# Patient Record
Sex: Male | Born: 1960
Health system: Southern US, Community
[De-identification: ages and names within clinical notes are randomized; demographics above are authoritative.]

## PROBLEM LIST (undated history)

## (undated) DIAGNOSIS — F410 Panic disorder [episodic paroxysmal anxiety] without agoraphobia: Secondary | ICD-10-CM

## (undated) DIAGNOSIS — E785 Hyperlipidemia, unspecified: Secondary | ICD-10-CM

## (undated) DIAGNOSIS — K219 Gastro-esophageal reflux disease without esophagitis: Secondary | ICD-10-CM

## (undated) DIAGNOSIS — G709 Myoneural disorder, unspecified: Secondary | ICD-10-CM

## (undated) DIAGNOSIS — I10 Essential (primary) hypertension: Secondary | ICD-10-CM

## (undated) DIAGNOSIS — E119 Type 2 diabetes mellitus without complications: Secondary | ICD-10-CM

## (undated) DIAGNOSIS — IMO0001 Reserved for inherently not codable concepts without codable children: Secondary | ICD-10-CM

## (undated) DIAGNOSIS — E7800 Pure hypercholesterolemia, unspecified: Secondary | ICD-10-CM

## (undated) DIAGNOSIS — Z794 Long term (current) use of insulin: Secondary | ICD-10-CM

## (undated) DIAGNOSIS — I2510 Atherosclerotic heart disease of native coronary artery without angina pectoris: Secondary | ICD-10-CM

## (undated) DIAGNOSIS — J449 Chronic obstructive pulmonary disease, unspecified: Secondary | ICD-10-CM

## (undated) HISTORY — DX: Chronic obstructive pulmonary disease, unspecified: J44.9

## (undated) HISTORY — DX: Panic disorder (episodic paroxysmal anxiety): F41.0

## (undated) HISTORY — PX: UPPER GASTROINTESTINAL ENDOSCOPY: SHX188

## (undated) HISTORY — DX: Essential (primary) hypertension: I10

## (undated) HISTORY — DX: Atherosclerotic heart disease of native coronary artery without angina pectoris: I25.10

## (undated) HISTORY — DX: Pure hypercholesterolemia, unspecified: E78.00

## (undated) HISTORY — DX: Myoneural disorder, unspecified: G70.9

## (undated) HISTORY — PX: COLONOSCOPY: SHX174

## (undated) HISTORY — DX: Long term (current) use of insulin: Z79.4

## (undated) HISTORY — DX: Gastro-esophageal reflux disease without esophagitis: K21.9

## (undated) HISTORY — DX: Hyperlipidemia, unspecified: E78.5

## (undated) HISTORY — DX: Type 2 diabetes mellitus without complications: E11.9

## (undated) HISTORY — DX: Reserved for inherently not codable concepts without codable children: IMO0001

---
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
## 1994-11-23 HISTORY — PX: OTHER SURGICAL HISTORY: SHX169

## 1998-11-23 HISTORY — PX: TONSILLECTOMY: SUR1361

## 1999-02-04 ENCOUNTER — Emergency Department (HOSPITAL_COMMUNITY): Admission: EM | Admit: 1999-02-04 | Discharge: 1999-02-04 | Payer: Self-pay | Admitting: Emergency Medicine

## 2001-11-22 ENCOUNTER — Emergency Department (HOSPITAL_COMMUNITY): Admission: EM | Admit: 2001-11-22 | Discharge: 2001-11-22 | Payer: Self-pay | Admitting: *Deleted

## 2001-11-22 ENCOUNTER — Encounter: Payer: Self-pay | Admitting: *Deleted

## 2004-05-16 ENCOUNTER — Ambulatory Visit (HOSPITAL_COMMUNITY): Admission: RE | Admit: 2004-05-16 | Discharge: 2004-05-16 | Payer: Self-pay | Admitting: Family Medicine

## 2005-08-21 ENCOUNTER — Encounter: Payer: Self-pay | Admitting: Cardiovascular Disease

## 2006-11-23 HISTORY — PX: CAROTID STENT: SHX1301

## 2006-12-24 ENCOUNTER — Inpatient Hospital Stay (HOSPITAL_BASED_OUTPATIENT_CLINIC_OR_DEPARTMENT_OTHER): Admission: RE | Admit: 2006-12-24 | Discharge: 2006-12-24 | Payer: Self-pay | Admitting: Cardiovascular Disease

## 2006-12-28 ENCOUNTER — Ambulatory Visit (HOSPITAL_COMMUNITY): Admission: RE | Admit: 2006-12-28 | Discharge: 2006-12-29 | Payer: Self-pay | Admitting: Cardiovascular Disease

## 2008-10-26 ENCOUNTER — Ambulatory Visit (HOSPITAL_COMMUNITY): Admission: RE | Admit: 2008-10-26 | Discharge: 2008-10-26 | Payer: Self-pay | Admitting: Cardiovascular Disease

## 2009-11-23 HISTORY — PX: OTHER SURGICAL HISTORY: SHX169

## 2010-04-07 ENCOUNTER — Inpatient Hospital Stay (HOSPITAL_BASED_OUTPATIENT_CLINIC_OR_DEPARTMENT_OTHER): Admission: RE | Admit: 2010-04-07 | Discharge: 2010-04-07 | Payer: Self-pay | Admitting: Cardiovascular Disease

## 2010-12-23 ENCOUNTER — Ambulatory Visit
Admission: RE | Admit: 2010-12-23 | Discharge: 2010-12-23 | Payer: Self-pay | Source: Home / Self Care | Attending: Orthopedic Surgery | Admitting: Orthopedic Surgery

## 2010-12-23 ENCOUNTER — Encounter: Payer: Self-pay | Admitting: Orthopedic Surgery

## 2010-12-23 DIAGNOSIS — E119 Type 2 diabetes mellitus without complications: Secondary | ICD-10-CM | POA: Insufficient documentation

## 2010-12-23 DIAGNOSIS — M7710 Lateral epicondylitis, unspecified elbow: Secondary | ICD-10-CM | POA: Insufficient documentation

## 2010-12-23 DIAGNOSIS — I10 Essential (primary) hypertension: Secondary | ICD-10-CM | POA: Insufficient documentation

## 2010-12-23 DIAGNOSIS — Z8679 Personal history of other diseases of the circulatory system: Secondary | ICD-10-CM | POA: Insufficient documentation

## 2010-12-31 NOTE — Assessment & Plan Note (Signed)
Summary: left elbow pain needs xr/bcbs/bsf   Vital Signs:  Patient profile:   50 year old male Height:      69 inches Weight:      210 pounds BMI:     31.12 Temp:     98 degrees F  Visit Type:  new patient  CC:  left elbow pain.  History of Present Illness: I saw Craig Hopkins in the office today for an initial visit.  He is a 50 years old man with the complaint of:  left elbow pain  Xrays today.  Medications: Lipitor, Plavix, Lotrel, Aspirin, Hydrochlorithiazide, Insulin  LEFT elbow pain x6 months without any history of injury. The patient does note that he does some lifting at his job and he plays golf. No trauma recently. He does have sharp dull pain, rated 7/10, which seems to be intermittent.  He was referred to [place] by [name].      Preventive Screening-Counseling & Management  Alcohol-Tobacco     Alcohol drinks/day: 2     Smoking Status: current     Packs/Day: 0.25  Caffeine-Diet-Exercise     Caffeine use/day: yes     Does Patient Exercise: yes  Allergies (verified): No Known Drug Allergies  Past History:  Past Medical History: Diabetes High blood pressure Heart disease  Past Surgical History: Tonsillectomy 2000 Left wrist 1976 Heart Catherization 2011 Stent 2008 Throat polyp removed 1996  Family History: FH of Cancer:  Family History Coronary Heart Disease male < 43  Social History: Patient is married.    [name]  Caffeine use/day:  yes Alcohol drinks/day:  2 Ethnicity:  White Does Patient Exercise:  yes Smoking Status:  current Packs/Day:  0.25  Review of Systems Musculoskeletal:  Complains of joint pain, stiffness, and muscle pain; denies swelling, instability, redness, and heat. Psychiatric:  Complains of anxiety; denies nervousness, depression, and hallucinations. Hemoatologic:  Complains of brusing; denies easy bleeding.  The review of systems is negative for Constitutional, Cardiovascular, Respiratory,  Gastrointestinal, Genitourinary, Neurologic, Endocrine, Skin, HEENT, and Immunology.  Physical Exam  Skin:  intact without lesions or rashes Psych:  alert and cooperative; normal mood and affect; normal attention span and concentration   Shoulder/Elbow Exam  General:    Well-developed, well-nourished, normal body habitus; no deformities, normal grooming.    Skin:    Intact, no scars, lesions, rashes, cafe au lait spots or bruising.    Inspection:    Inspection is normal.    Palpation:    lateral epicondyle on the bone   Vascular:    Radial, ulnar, brachial, and axillary pulses 2+ and symmetric; capillary refill less than 2 seconds; no evidence of ischemia, clubbing, or cyanosis.    Sensory:    Gross sensation intact in the upper extremities.    Motor:    Normal strength in the upper extremities.    Elbow Exam:    Right:    Inspection/Palpation:  FROM with painful extension and pain elicited with wrist extension vs resisitance    Impression & Recommendations:  Problem # 1:  LATERAL EPICONDYLITIS (ICD-726.32) Assessment New  Separate and Identifiable X-Ray report      2 views left elbow   Normal joint and bobe   IMPR normal xrays elbow   Orders: New Patient Level III (30865) Elbow x-ray, 2 views (78469) Injection, Tendon / Ligament (62952) Depo- Medrol 40mg  (J1030)  Patient Instructions: 1)  ASPERCREME three times a day 2)  You have received an injection of cortisone  today. You may experience increased pain at the injection site. Apply ice pack to the area for 20 minutes every 2 hours and take 2 xtra strength tylenol every 8 hours. This increased pain will usually resolve in 24 hours. The injection will take effect in 3-10 days.  3)  Re check in 6 weeks    Orders Added: 1)  New Patient Level III [34742] 2)  Elbow x-ray, 2 views [73070] 3)  Injection, Tendon / Ligament [20550] 4)  Depo- Medrol 40mg  [J1030]

## 2011-01-14 NOTE — Letter (Signed)
Summary: History form  History form   Imported By: Jacklynn Ganong 01/08/2011 16:55:17  _____________________________________________________________________  External Attachment:    Type:   Image     Comment:   External Document

## 2011-01-21 ENCOUNTER — Encounter: Payer: Self-pay | Admitting: Orthopedic Surgery

## 2011-02-04 ENCOUNTER — Ambulatory Visit: Payer: Self-pay | Admitting: Orthopedic Surgery

## 2011-04-07 NOTE — Cardiovascular Report (Signed)
NAMEQUINLIN, CONANT               ACCOUNT NO.:  [id number]   MEDICAL RECORD NO.:  [id number]          PATIENT TYPE:  OIB   LOCATION:  2899                         FACILITY:  [facility]   PHYSICIAN:  Vesta Mixer, M.D. DATE OF BIRTH:  July 17, 1961   DATE OF PROCEDURE:  DATE OF DISCHARGE:  10/26/2008                            CARDIAC CATHETERIZATION   Craig Hopkins is a 50 year old gentleman with a history of coronary  artery disease.  He has a history of diabetes mellitus.  He recently was  seen in the office and was having some episodes of angina-like chest  pain.  The chest pains were somewhat unusual, but they were very similar  to his previous episodes of angina years ago, prior to his stent.  He is  scheduled for heart catheterization.   The procedure was left heart catheterization with coronary angiography.   The right femoral artery was easily cannulated using the modified  Seldinger technique.   HEMODYNAMICS:  LV pressure is 140/7 with an aortic pressure of 140/84.   ANGIOGRAPHY:  Left main:  The left main has minor luminal  irregularities.  There is perhaps a 10% stenosis at the ostium.   The left anterior descending artery has a long 20-30% stenosis just  prior to the stent.  This does not appear to obstruct flow in any way.  The mid-LAD stent is widely patent.  There is a normal step up and step  down associated with the stent.  The distal LAD is widely patent.  There  are a few minor luminal irregularities.  The diagonal arteries are  unremarkable.  There are several small diagonal vessels that have some  stenosis at the origin, but they really do not supply a large amount of  myocardium.  The distal diagonal vessels appear to be bigger than the  more proximal diagonal vessels.   The left circumflex artery is a large vessel.  It gives off a large  first obtuse marginal vessel.  There are minor luminal irregularities in  these vessels.  No critical stenosis.   The  right coronary artery is small to moderate in size but is dominant.  There are minor luminal irregularities.  The posterior descending artery  is unremarkable.  The posterolateral branch is quite small.   The left ventriculogram was performed in the 30 RAO position.  This  reveals normal left ventricular systolic function.  His ejection  fraction is 65%.  There is no mitral regurgitation.   COMPLICATIONS:  None.   CONCLUSION:  History of coronary artery disease but with a widely patent  left anterior descending stent.  He has minor luminal irregularities.  We will discharge him from the hospital.      Vesta Mixer, M.D.  Electronically Signed     PJN/MEDQ  D:  10/26/2008  T:  10/26/2008  Job:  [id number]

## 2011-04-07 NOTE — H&P (Signed)
NAMEISIDORO, Craig Hopkins               ACCOUNT NO.:  [id number]   MEDICAL RECORD NO.:  [id number]            PATIENT TYPE:   LOCATION:                                 FACILITY:   PHYSICIAN:  Vesta Mixer, M.D.      DATE OF BIRTH:   DATE OF ADMISSION:  DATE OF DISCHARGE:                              HISTORY & PHYSICAL   HISTORY:  Craig Hopkins is a middle-aged gentleman with a history of  diabetes mellitus, hyperlipidemia, hypertension, and coronary artery  disease.  He is admitted for heart catheterization after having  recurrent episodes of chest pain.   Craig Hopkins is a young gentleman with a long history of diabetes mellitus.  He  presented to me a year or so ago with some unusual episodes of chest  discomfort.  He had a stress Cardiolite study which was basically  unremarkable.  He has good exercise capacity and had only minimal  abnormality on his Cardiolite study.  Nevertheless, he continued to have  symptoms and we ultimately did a heart catheterization which revealed a  tight stenosis in his mid LAD.  He subsequently went on to have a PTCA  and stenting of the LAD using a 3.5 x 20 mm Taxus stent.  Post stent  dilatation was achieved using a 3.75-mm Quantum Monorail inflated up to  14 atmospheres.  He tolerated the procedure quite well and did not have  any other problems.  He continues to run and has been exercising fair  little.  He recently noticed some fullness or an unusual feeling up in  his chest.  He does not have any pain, but he typically has not had pain  when he had angina equivalent symptoms.  He has occasional episodes of  chest discomfort with fullness which radiates down into his left arm and  caused some left arm tingling.  He denies any syncope or presyncope.  He  denies any PND, orthopnea.  He has been trying to eat bit better.  He is  pretty much what he wants still on the weekends, but is trying to eat a  low salt, low-fat diet during the week.  He has also been  exercising on  a fairly regular basis.   CURRENT MEDICATIONS:  1. Insulin pump each day as directed by Dr. Evlyn Kanner.  2. Hydrochlorothiazide 25 mg a day.  3. Prilosec every other day.  4. Plavix 75 mg a day.  5. Lipitor 40 mg a day.  6. Aspirin 81 mg a day.  7. Lotrel once a day.   ALLERGIES:  None.   PAST MEDICAL HISTORY:  1. History of coronary artery disease - status post PTCA and stenting      of his mid LAD.  2. Diabetes mellitus.  3. Hyperlipidemia.  4. Hypertension.   SOCIAL HISTORY:  The patient used to smoke, but quit 2 years ago.  He  drinks alcohol weekly.   FAMILY HISTORY:  His father died at age 36 due to myocardial infarction.  His father had two myocardial infarctions.  His mother has a history of  breast cancer.   REVIEW OF SYSTEMS:  He denies any problems with heat or cold  intolerance, weight gain, or weight loss.  In fact, he has lost little  bit of weight over the past couple months because of improved diet and  exercise program.  He denies any headache or visual changes.  He denies  any vertigo or dizziness.  He denies any sore throat, cough, or sputum  production.  He denies any palpitations.  He denies any wheezing, cough,  shortness of breath, or hemoptysis.  He denies any nausea, vomiting, or  constipation, or change in his bowel habits.  He denies any problems  with his gait and in fact he runs fairly regular.  His review of systems  is reviewed and is otherwise negative.   On exam, he is a middle-aged gentleman in no acute distress.  He is  alert and oriented x3.  His mood and affect are normal.  His weight is  207, blood pressure is 144/90.  His heart rate 56.  His HEENT exam  reveals 2+ carotids.  He has no bruits, no JVD, no thyromegaly.  His  neck is supple.  Sclerae are nonicteric.  His mucous membranes are  moist.  His lungs are clear.  Chest expansion is normal.  Heart, regular  rate, S1 and S2.  His PMI is nondisplaced.  Abdominal exam  reveals good  bowel sounds and is nontender.  Extremities has no clubbing, cyanosis,  or edema.  Neurologic exam is nonfocal.   Craig Hopkins presents with some episodes of chest discomfort as he would put it  an unusual feeling in his chest.  There are some radiation down his left  arm.  Because he had significant coronary artery disease last year and  at the same time, had a relatively unremarkable Cardiolite study.  I am  not sure that we can use Cardiolite scan to completely rule out coronary  artery disease.  We discussed the risks, benefits, and options of heart  catheterization.  He understands and agrees to have heart  catheterization for further evaluation of this issue.  I think it is  certainly reasonable to proceed with heart catheterization since the  previous stress test was unreliable.  We will schedule him for a heart  catheterization on October 26, 2008.      Vesta Mixer, M.D.  Electronically Signed     PJN/MEDQ  D:  10/23/2008  T:  10/24/2008  Job:  [id number]   cc:   Jeannett Senior A. Evlyn Kanner, M.D.

## 2011-04-10 NOTE — Cardiovascular Report (Signed)
Craig Hopkins, Craig Hopkins               ACCOUNT NO.:  [id number]   MEDICAL RECORD NO.:  [id number]          PATIENT TYPE:  OIB   LOCATION:  1965                         FACILITY:  [facility]   PHYSICIAN:  Vesta Mixer, M.D. DATE OF BIRTH:  [birth year]   DATE OF PROCEDURE:  12/24/2006  DATE OF DISCHARGE:                            CARDIAC CATHETERIZATION   Mr. Brosseau is a 50 year old gentleman with a history of hypertension,  insulin-dependent diabetes mellitus, hypercholesterolemia, and cigarette  smoking.  He was referred for a stress Cardiolite study after having  pressure-like chest pain.  The pains will occasionally radiate down his  left arm.  They are not necessarily associated with a specific activity.   He had a stress Cardiolite study which revealed reversible ischemia of  the apex.  He was scheduled for heart catheterization for further  evaluation.   PROCEDURE:  Left heart catheterization with coronary angiography.   HEMODYNAMIC RESULTS:  LV pressure is 114/6, with an aortic pressure of  114/92.   ANGIOGRAPHY:  The left main is fairly smooth and normal.   The left anterior descending artery is a moderate to large vessel.  There is a long stenosis in the proximal LAD of about 30-35%.  Following  this, there is a very short normal segment, and then in the mid-LAD  there is a hazy 80-90% stenosis.  This stenosis is approximately 15-20  mm in length.  The lesion appears to be hazy in some views.  The distal  LAD reaches around the apex and supplies the inferoapical wall.  There  are no significant irregularities in the distal LAD.   The left circumflex artery is normal.  There is a large first obtuse  marginal artery which is normal.  The posterior descending artery is  normal.   The right coronary artery is a moderate-sized vessel.  There are  proximal irregularities between 10 and 20%.  The mid-RCA has a 20 to 25%  stenosis.  The posterior descending artery and the  posterolateral  segment artery have minor luminal irregularities.   Left ventriculogram was performed in a 30 RAO position.  It reveals a  mildly dilated left ventricle.  The left ventricular systolic function  is well-preserved.  Ejection fraction is 55-60%.  There is normal  contractility of the anterior wall and apex.   COMPLICATIONS:  None.   CONCLUSION:  1. Single-vessel coronary artery disease involving mid-left anterior      descending.  He also has some mild disease      in the mid-right.  We will schedule him for percutaneous coronary      intervention of his mid-left anterior descending next week.  We      will need to have him stop smoking.  He will need tighter control      on his cholesterol medicines.  He will also need tighter glucose      control.  We will start him on Plavix today.           ______________________________  Vesta Mixer, M.D.     PJN/MEDQ  D:  12/24/2006  T:  12/24/2006  Job:  [id number]   cc:   Tera Mater. Evlyn Kanner, M.D.

## 2011-04-10 NOTE — Discharge Summary (Signed)
NAMEPAULINO, Craig Hopkins               ACCOUNT NO.:  [id number]   MEDICAL RECORD NO.:  [id number]          PATIENT TYPE:  OIB   LOCATION:  6529                         FACILITY:  [facility]   PHYSICIAN:  Vesta Mixer, M.D. DATE OF BIRTH:  [birth year]   DATE OF ADMISSION:  12/28/2006  DATE OF DISCHARGE:  12/29/2006                               DISCHARGE SUMMARY   DISCHARGE DIAGNOSES:  1. Coronary artery disease, status post percutaneous transluminal      coronary angioplasty and stenting of his left anterior descending      artery.  2. Hypertension.  3. Diabetes mellitus.  4. Hyperlipidemia.   DISCHARGE MEDICATIONS:  1. Plavix 75 mg a day.  2. Z-Pak as directed.  3. Aspirin 325 mg a day.  4. Altace 10 mg p.o. b.i.d.  5. Prilosec over-the-counter once a day.  6. Hydrochlorothiazide 25 mg once a day.  7. Chantix 1 mg twice a day.  8. Lipitor 40 mg a day.  9. Insulin pump as directed by Dr. Evlyn Kanner.  10.Nitroglycerin 0.4 mg as needed.   DISPOSITION:  The patient will see Dr. Elease Hashimoto in 1-2 weeks for followup  visit.  He is to see Dr. Evlyn Kanner as needed.   HISTORY:  Craig Hopkins is a 50 year old gentleman who was recently  found to have a tight LAD stenosis.  He was admitted for PCI of his LAD.   The patient was found by diagnostic heart catheterization to have a  tight 80-90% mid LAD stenosis.  He underwent successful PTCA and  stenting.  We used a 3.5-mm x 20-mm Taxus stent was deployed at 12  atmospheres.  Post stent diltation was achieved using a 3.75-mm Quantum  balloon.  It was inflated up to 18 atmospheres in the middle of the  vessel and then 12 and 14 on each of the ends.  This resulted in a very  nice angiographic result with an zero percent residual.  He now is  discharged in satisfactory condition.  He will continue on the same  medications.  All of his other medical problems were stable.           ______________________________  Vesta Mixer, M.D.    PJN/MEDQ  D:   12/29/2006  T:  12/29/2006  Job:  [id number]   cc:   Jeannett Senior A. Evlyn Kanner, M.D.

## 2011-04-10 NOTE — Cardiovascular Report (Signed)
Craig Hopkins, Craig Hopkins               ACCOUNT NO.:  [id number]   MEDICAL RECORD NO.:  [id number]          PATIENT TYPE:  OIB   LOCATION:  2807                         FACILITY:  [facility]   PHYSICIAN:  Vesta Mixer, M.D. DATE OF BIRTH:  01-23-1961   DATE OF PROCEDURE:  12/28/2006  DATE OF DISCHARGE:                            CARDIAC CATHETERIZATION   Mathews is a 50 year old gentleman with a history of hyperlipidemia,  cigarette smoking and diabetes mellitus.  He recently had a diagnostic  heart catheterization that revealed a tight mid-LAD stenosis.  He is now  referred for PCI of his mid LAD.   The patient was preloaded with Plavix 4 days prior to the procedure.   PROCEDURE:  1. Left heart catheterization.  2. Percutaneous transluminal cardiac angioplasty and stenting of the      mid left anterior descending.   DESCRIPTION OF PROCEDURE:  The right femoral artery was easily  cannulated using a modified Seldinger technique.  We placed a 7-French  catheter.  The patient received a total of 6500 units of heparin.  He  received a double bolus Integrilin drip.  ACT was 270.   The left main was engaged using a Judkins left 4 guide.  The initial  angiography revealed a 30-35% stenosis in the proximal LAD with a tight  80-90% stenosis in the mid LAD.   A short BMW wire was placed down into the distal left anterior  descending artery.  A 3.0 x 15 mm Quantum Maverick was placed across the  stenosis and was inflated to 10 atmospheres for 35 seconds followed by  14 atmospheres for 25 seconds.  This resulted in some improvement of the  vessel lumen, but clearly the 3.0 balloon was undersized.   This point, a 3.5 x 20 mm TAXUS was positioned across the stenosis.  It  was deployed at 12 atmospheres for 40 seconds.   Post stent dilatation was achieved using a 3.75 x 15 mm Quantum  Maverick.  Was inflated in the center of the stent up to 14 atmospheres  for 30 seconds.  It was then placed in  the distal aspect of the stent  and was inflated up to 12 atmospheres for 15 seconds and then pulled  back to the proximal edge to 12 atmospheres for 15 seconds.  There was  still a slight waist in the middle of the stent, so once again the  balloon was positioned in the middle of stent and was inflated up to 18  atmospheres for 25 seconds.  This resulted in the very nice lumen with a  0% residual stenosis.  The patient tolerated the procedure quite well.   COMPLICATIONS:  None.   CONCLUSION:  Primarily single-vessel coronary artery disease.  Status  post successful percutaneous transluminal cardiac angioplasty and  stenting of the mid left anterior descending. He does have moderate  disease in the proximal left anterior descending and has moderate  disease in the right.  We will treat these areas medically.  We will  keep him overnight anticipate discharge tomorrow.  ______________________________  Vesta Mixer, M.D.     PJN/MEDQ  D:  12/28/2006  T:  12/28/2006  Job:  [id number]   cc:   Jeannett Senior A. Evlyn Kanner, M.D.

## 2011-05-13 ENCOUNTER — Other Ambulatory Visit: Payer: Self-pay | Admitting: Cardiovascular Disease

## 2011-05-13 DIAGNOSIS — E785 Hyperlipidemia, unspecified: Secondary | ICD-10-CM

## 2011-05-13 NOTE — Telephone Encounter (Signed)
Pt called/msg left for need of yearly ov with labs, 90 days given and lab orders placed.

## 2011-08-28 LAB — GLUCOSE, CAPILLARY
Glucose-Capillary: 234 mg/dL — ABNORMAL HIGH (ref 70–99)
Glucose-Capillary: 257 mg/dL — ABNORMAL HIGH (ref 70–99)

## 2011-11-12 ENCOUNTER — Other Ambulatory Visit: Payer: Self-pay | Admitting: Cardiovascular Disease

## 2011-11-12 NOTE — Telephone Encounter (Signed)
Dosage was 80 mg -generic for lipitor he breaks in half

## 2011-11-12 NOTE — Telephone Encounter (Signed)
Left a message on pt cell phone wanting him to call office to verify his dosage for his Lipitor.

## 2011-11-13 NOTE — Telephone Encounter (Signed)
Pt needs appointment then refill can be made Fax Received. Refill Completed. Aaryanna Hyden Chowoe (R.M.A)   

## 2012-01-12 ENCOUNTER — Other Ambulatory Visit: Payer: Self-pay | Admitting: Cardiovascular Disease

## 2012-01-12 MED ORDER — ATORVASTATIN CALCIUM 80 MG PO TABS: 40.0000 mg | ORAL_TABLET | Freq: Every day | ORAL | Status: DC

## 2012-01-12 NOTE — Telephone Encounter (Signed)
Pt needs a refill and he needs 90 day supply with 3 refills and he needs double the dose and he will split the pills

## 2012-01-12 NOTE — Telephone Encounter (Signed)
Spoke to pt and told him that he has to keep his Appt with Dr. Elease Hashimoto for him to get the 90 days supply with refill. Pt then requested 45 days supply of his Lipitor to last him til his appt. Fax Received. Refill Completed. Gunhild Bautch Chowoe (R.M.A)

## 2012-02-23 ENCOUNTER — Ambulatory Visit (INDEPENDENT_AMBULATORY_CARE_PROVIDER_SITE_OTHER): Payer: BC Managed Care – PPO | Admitting: Cardiovascular Disease

## 2012-02-23 ENCOUNTER — Ambulatory Visit: Payer: BC Managed Care – PPO | Admitting: Cardiovascular Disease

## 2012-02-23 ENCOUNTER — Encounter: Payer: Self-pay | Admitting: Cardiovascular Disease

## 2012-02-23 VITALS — BP 128/82 | HR 100 | Resp 18 | Ht 68.0 in | Wt 213.4 lb

## 2012-02-23 DIAGNOSIS — I2510 Atherosclerotic heart disease of native coronary artery without angina pectoris: Secondary | ICD-10-CM | POA: Insufficient documentation

## 2012-02-23 DIAGNOSIS — I10 Essential (primary) hypertension: Secondary | ICD-10-CM

## 2012-02-23 DIAGNOSIS — E785 Hyperlipidemia, unspecified: Secondary | ICD-10-CM | POA: Insufficient documentation

## 2012-02-23 NOTE — Assessment & Plan Note (Addendum)
Ashely seems to be doing fairly well. He does have some dyspnea on exertion but I suspect that this is due to his overall deconditioning. I don't think he is having episodes of silent ischemia.  We will have him continue with his current dose of atorvastatin and Plavix. Check a lipid level this week and again in 6 months when I see him for an office visit.  I've asked him to go and completely stop smoking. I've recommended that he use some of the Nicorette products such as the inhaler or patch. I've asked him to call me if he has any other problems.

## 2012-02-23 NOTE — Patient Instructions (Signed)
Your physician wants you to follow-up in: 6 months You will receive a reminder letter in the mail two months in advance. If you don't receive a letter, please call our office to schedule the follow-up appointment.  Your physician recommends that you return for a FASTING lipid profile: 6 months and this friday

## 2012-02-23 NOTE — Progress Notes (Signed)
Craig Hopkins Date of Birth  [birth year] [facility]     [place] Office  [address]    [address]   [address] [place], [place]  [address]    [place], [place]  [address] [phone]  Fax  [phone]  [phone]  Fax [phone]  Problem List: 1. CAD - s/p stenting of his LAD 2. Diabetes Mellitus 3. Hypertension  History of Present Illness:  Craig Hopkins is a 51 y.o. gentleman with a hx as noted above.  He has continued to have intermittant cp.  He has some dyspnea with exertion. His last cath was 2011 which revealed a patent LAD stent.  He's had some dyspnea on exertion but he admits that he has not been exercising at all. He still smokes a few cigarettes a week.  Current Outpatient Prescriptions on File Prior to Visit  Medication Sig Dispense Refill  . Amlodipine Besy-Benazepril HCl (LOTREL PO) Take by mouth daily.        Marland Kitchen aspirin 325 MG tablet Take 325 mg by mouth daily.        Marland Kitchen atorvastatin (LIPITOR) 80 MG tablet Take 0.5 tablets (40 mg total) by mouth daily.  45 tablet  0  . BuPROPion HCl (WELLBUTRIN PO) Take by mouth daily.      . clopidogrel (PLAVIX) 75 MG tablet Take 75 mg by mouth daily.        . hydrochlorothiazide (HYDRODIURIL) 25 MG tablet Take 25 mg by mouth daily.        Marland Kitchen NOVOLOG 100 UNIT/ML injection       . OMEPRAZOLE PO Take by mouth daily.        No Known Allergies  Past Medical History  Diagnosis Date  . HTN (hypertension)   . Panic attack   . GERD (gastroesophageal reflux disease)   . IDDM (insulin dependent diabetes mellitus)     Past Surgical History  Procedure Date  . Tonsillectomy 2000  . Left wrist 1976  . Heart catherization 2011  . Carotid stent 2008  . Throat polyp removed 1996    History  Smoking status  . Smoker, Current Status Unknown  Smokeless tobacco  . Not on file    History  Alcohol Use  . 0.5 oz/week  . 1 drink(s) per week    Family History  Problem Relation Age of Onset  . Cancer      family  history   . Coronary artery disease      family history   . Coronary artery disease Father   . Stroke Father   . Breast cancer Mother 21    Reviw of Systems:  Reviewed in the HPI.  All other systems are negative.  Physical Exam: Blood pressure 128/82, pulse 100, resp. rate 18, height 5\' 8"  (1.727 m), weight 213 lb 6.4 oz (96.798 kg). General: Well developed, well nourished, in no acute distress.  Head: Normocephalic, atraumatic, sclera non-icteric, mucus membranes are moist,   Neck: Supple. Carotids are 2 + without bruits. No JVD  Lungs: Clear bilaterally to auscultation.  Heart: regular rate.  normal  S1 S2. No murmurs, gallops or rubs.  Abdomen: Soft, non-tender, non-distended with normal bowel sounds. No hepatomegaly. No rebound/guarding. No masses.  Msk:  Strength and tone are normal  Extremities: No clubbing or cyanosis. No edema.  Distal pedal pulses are 2+ and equal bilaterally.  Neuro: Alert and oriented X 3. Moves all extremities spontaneously.  Psych:  Responds to questions appropriately with  a normal affect.  ECG: 02/23/2012. Normal sinus rhythm at 81 beats a minute. Normal EKG.  Assessment / Plan:

## 2012-02-26 ENCOUNTER — Other Ambulatory Visit (INDEPENDENT_AMBULATORY_CARE_PROVIDER_SITE_OTHER): Payer: BC Managed Care – PPO

## 2012-02-26 DIAGNOSIS — E785 Hyperlipidemia, unspecified: Secondary | ICD-10-CM

## 2012-02-26 LAB — HEPATIC FUNCTION PANEL
ALT: 29 U/L (ref 0–53)
Albumin: 3.6 g/dL (ref 3.5–5.2)
Bilirubin, Direct: 0.2 mg/dL (ref 0.0–0.3)
Total Protein: 6.7 g/dL (ref 6.0–8.3)

## 2012-02-26 LAB — BASIC METABOLIC PANEL
CO2: 31 mEq/L (ref 19–32)
Chloride: 102 mEq/L (ref 96–112)
Creatinine, Ser: 1 mg/dL (ref 0.4–1.5)
Glucose, Bld: 111 mg/dL — ABNORMAL HIGH (ref 70–99)

## 2012-02-26 LAB — LIPID PANEL
Cholesterol: 119 mg/dL (ref 0–200)
Total CHOL/HDL Ratio: 2
Triglycerides: 45 mg/dL (ref 0.0–149.0)

## 2012-04-26 ENCOUNTER — Encounter: Payer: Self-pay | Admitting: Cardiovascular Disease

## 2012-04-27 ENCOUNTER — Encounter: Payer: Self-pay | Admitting: Cardiovascular Disease

## 2012-05-05 ENCOUNTER — Other Ambulatory Visit: Payer: Self-pay | Admitting: Cardiovascular Disease

## 2012-05-05 NOTE — Telephone Encounter (Signed)
Fax Received. Refill Completed. Kathleena Freeman Chowoe (R.M.A)   

## 2012-05-06 DIAGNOSIS — Z720 Tobacco use: Secondary | ICD-10-CM | POA: Insufficient documentation

## 2013-03-03 ENCOUNTER — Encounter: Payer: Self-pay | Admitting: Cardiovascular Disease

## 2013-05-29 ENCOUNTER — Other Ambulatory Visit: Payer: Self-pay | Admitting: *Deleted

## 2013-05-29 MED ORDER — ATORVASTATIN CALCIUM 80 MG PO TABS: 40.0000 mg | ORAL_TABLET | Freq: Every day | ORAL | Status: DC

## 2013-05-29 NOTE — Telephone Encounter (Signed)
NEED APPOINTMENT. left message for pt call office to make 1 yr OV. number provided. Fax Received. Refill Completed. Wynette Jersey Chowoe (R.M.A)

## 2014-03-07 ENCOUNTER — Other Ambulatory Visit: Payer: Self-pay | Admitting: Cardiovascular Disease

## 2014-03-08 ENCOUNTER — Telehealth: Payer: Self-pay

## 2014-03-08 NOTE — Telephone Encounter (Signed)
Needs lab work LFTls and lipid profile to fill lipitor

## 2014-03-09 NOTE — Telephone Encounter (Signed)
Called wanting to get refill on his Lipitor.  Advised that we needed recent labs.  He states Dr. Forde Dandy checked his cholesterol about 3 months ago.  He will call their office and see if they would refill.  Also explained to him that he had not been seen by Dr. Acie Fredrickson since 02/2012.  Also asked him to have Dr. Baldwin Crown office send Dr. Acie Fredrickson the lab results so we can have for our records.  He will call them to do refill and labs to be sent here. Also needs an appointment with Dr. Acie Fredrickson.

## 2014-03-09 NOTE — Telephone Encounter (Signed)
lmovm Debbie Zackrey Dyar RN  

## 2014-04-23 ENCOUNTER — Telehealth: Payer: Self-pay | Admitting: Orthopedic Surgery

## 2014-04-23 NOTE — Telephone Encounter (Signed)
Call received from patient, following having been seen at Urgent care in [facility] "not the [facility]" urgent care.  States had Xrays and was told has a humerus fracture.  Michela Pitcher was seen there by an orthopedic specialist.  Patient was seen last here by Dr Aline Brochure in 2012. Advised patient of records, films needed for review, as it would be a 2nd opinion, if it was an orthopedist who had initially treated.  He will request this information and was told to contact us immediately due to the nature of the problem needing appointment soon as possoble.  His ph# is [phone]

## 2014-04-25 NOTE — Telephone Encounter (Signed)
Per Dr Aline Brochure 04/24/14, following receipt of office notes from Raliegh Ip - appointment approved.  Called back to patient 04/25/14, scheduled appointment.

## 2014-04-30 ENCOUNTER — Ambulatory Visit (INDEPENDENT_AMBULATORY_CARE_PROVIDER_SITE_OTHER): Payer: BC Managed Care – PPO | Admitting: Orthopedic Surgery

## 2014-04-30 ENCOUNTER — Encounter: Payer: Self-pay | Admitting: Orthopedic Surgery

## 2014-04-30 VITALS — BP 123/90 | Ht 69.0 in | Wt 201.0 lb

## 2014-04-30 DIAGNOSIS — S42201A Unspecified fracture of upper end of right humerus, initial encounter for closed fracture: Secondary | ICD-10-CM

## 2014-04-30 DIAGNOSIS — S42209A Unspecified fracture of upper end of unspecified humerus, initial encounter for closed fracture: Secondary | ICD-10-CM

## 2014-04-30 MED ORDER — OXYCODONE-ACETAMINOPHEN 5-325 MG PO TABS: 1.0000 | ORAL_TABLET | ORAL | Status: DC | PRN

## 2014-04-30 MED ORDER — HYDROCODONE-ACETAMINOPHEN 5-325 MG PO TABS: 1.0000 | ORAL_TABLET | ORAL | Status: DC | PRN

## 2014-04-30 NOTE — Progress Notes (Signed)
Patient ID: Craig Hopkins, male   DOB: January 10, 1961, 53 y.o.   MRN: [id number]  Chief Complaint  Patient presents with  . Pain    Proximal right humerus fracture d/t injury 04/20/14    BP 123/90  Ht 5\' 9"  (1.753 m)  Wt 201 lb (91.173 kg)  BMI 29.67 kg/m2  This is a 53 year old male who was running in the house with his son jumped over the couch landed on a hardwood floor onto the right shoulder and sustained a proximal humerus fracture nondisplaced. Initial treatment at [facility] urgent care. Injury on May 29.  He complains of stiffness sharp dull throbbing pain over the right shoulder with some radiation into the upper arm. His pain is relieved by Percocet. His pain is exacerbated by movement. He is in a sling-and-swathe. He works from home.  His review of systems is negative except for his symptoms related to diabetes to musculoskeletal pain from this injury and the fact that he needs reading glasses  He has no medication allergies. He has a family history of diabetes and heart disease and heart are tension and cancer  His parents are deceased 1 died at 36 of cancer and his father died of a heart attack at 55  Past medical history of diabetes hypertension heart disease. A. status post left wrist fracture followup excision from the vocal cord tonsillectomy and a stent placed  Medications reviewed and recorded  Examination Vital signs: BP 123/90  Ht 5\' 9"  (1.753 m)  Wt 201 lb (91.173 kg)  BMI 29.67 kg/m2   General the patient is well-developed and well-nourished grooming and hygiene are normal Oriented x3 Mood and affect normal Ambulation normal  ISkin clean dry and intact  Cardiovascular exam is normal Sensory exam normal  Right Shoulder Exam   Tenderness  Right shoulder tenderness location: right proximal humerus   Range of Motion  Active Abduction: abnormal  Passive Abduction: abnormal  Extension: abnormal  Forward Flexion: abnormal  External Rotation:  abnormal  Internal Rotation 0 degrees: abnormal  Internal Rotation 90 degrees: abnormal   Muscle Strength  Right shoulder normal muscle strength: normal tone   Other  Erythema: present Scars: absent Sensation: normal Pulse: present   Left Shoulder Exam  Left shoulder exam is normal.     The x-rays were brought in on a disc AP lateral nondisplaced proximal humerus fracture at the surgical  I explained the treatment options and rationale  Recommend sling-and-swathe for an additional week an x-ray and then if x-rays show no displacement and we can entertain physical therapy

## 2014-04-30 NOTE — Patient Instructions (Signed)
Shoulder Fracture (Proximal Humerus or Glenoid) °A shoulder fracture is a broken upper arm bone or a broken socket bone. The humerus is the upper arm bone and the glenoid is the shoulder socket. Proximal means the humerus is broken near the shoulder. Most of the time the bones of a broken shoulder are in an acceptable position. Usually, the injury can be treated with a shoulder immobilizer or sling and swath bandage. These devices support the arm and prevent any shoulder movement. If the bones are not in a good position, then surgery is sometimes needed. Shoulder fractures usually initially cause swelling, pain, and discoloration around the upper arm. They heal in 8 to 12 weeks with proper treatment. °SYMPTOMS  °At the time of injury: °· Pain. °· Tenderness. °· Regular body contours are not normal. °Later symptoms may include: °· Swelling and bruising of the elbow and hand. °· Swelling and bruising of the arm or chest. °Other symptoms include: °· Pain when lifting or turning the arm. °· Paralysis below the fracture. °· Numbness or coldness below the fracture. °CAUSES  °· Indirect force from falling on an outstretched arm. °· A blow to the shoulder. °RISK INCREASES WITH: °· Not being in shape. °· Playing contact sports, such as football, soccer, hockey, or rugby. °· Sports where falling on an outstretched arm occurs, such as basketball, skateboarding, or volleyball. °· History of bone or joint disease. °· History of shoulder injury. °PREVENTION °· Warm up before activity. °· Stretch before activity. °· Stay in shape with your: °· Heart fitness. °· Flexibility. °· Shoulder Strength. °· Falling with the proper technique. °PROGNOSIS  °In adults, healing time is about 7 weeks. For children, healing time is about 5 weeks. Surgery may be needed. °RELATED COMPLICATIONS °· The bones do not heal together (nonunion). °· The bones do not align properly when they heal (malunion). °· Flecia Shutter-term problems with pain, stiffness,  swelling, or loss of motion. °· The injured arm heals shorter than the other. °· Nerves are injured in the arm. °· Arthritis in the shoulder. °· Normal bone growth is interrupted in children. °· Blood supply to the shoulder joint is diminished. °TREATMENT °If the bones are aligned, then initial treatment will be with ice and medicine to help with pain. The shoulder will be held in place with a sling (immobilization). The shoulder will be allowed to heal for up to 6 weeks. Injuries that may need surgery include: °· Severe fractures. °· Fractures that are not in appropriate alignment (displaced). °· Non-displaced fractures (not common). °Surgery helps the bones align correctly. The bones may be held in place with: °· Sutures. °· Wires. °· Rods. °· Plates. °· Screws. °· Pins. °If you have had surgery or not, you will likely be assisted by a physical therapist or athletic trainer to get the best results with your injured shoulder. This will likely include exercises to strengthen and stretch the injured and surrounding areas. °MEDICATION °· If pain medicine is needed, nonsteroidal anti-inflammatory medicines (such as aspirin or ibuprofen) or other minor pain relievers (such as acetaminophen) are often advised. °· Do not take pain medicine for 7 days before surgery. °· Stronger pain relievers may be prescribed. Use only as directed and take only as much as you need. °COLD THERAPY °Cold treatment (icing) relieves pain and reduces inflammation. Cold treatment should be applied for 10 to 15 minutes every 2 to 3 hours, and immediately after activity that aggravates your symptoms. Use ice packs or an ice massage. °SEEK IMMEDIATE   MEDICAL CARE IF: °· You have severe shoulder pain unrelieved by rest and taking pain medicine. °· You have pain, numbness, tingling, or weakness in the hand or wrist. °· You have shortness of breath, chest pain, severe weakness, or fainting. °· You have severe pain with motion of the fingers or  wrist. °· Blue, gray, or dark color appears in the fingernails on injured extremity. °Document Released: 11/09/2005 Document Revised: 02/01/2012 Document Reviewed: 02/21/2009 °Exit[employer]® Patient Information ©2014 [employer]. ° °

## 2014-05-07 ENCOUNTER — Ambulatory Visit (INDEPENDENT_AMBULATORY_CARE_PROVIDER_SITE_OTHER): Payer: BC Managed Care – PPO

## 2014-05-07 ENCOUNTER — Encounter: Payer: Self-pay | Admitting: Orthopedic Surgery

## 2014-05-07 ENCOUNTER — Ambulatory Visit (INDEPENDENT_AMBULATORY_CARE_PROVIDER_SITE_OTHER): Payer: Self-pay | Admitting: Orthopedic Surgery

## 2014-05-07 VITALS — BP 150/91 | Ht 69.0 in | Wt 201.0 lb

## 2014-05-07 DIAGNOSIS — S4290XA Fracture of unspecified shoulder girdle, part unspecified, initial encounter for closed fracture: Secondary | ICD-10-CM

## 2014-05-07 DIAGNOSIS — S42201A Unspecified fracture of upper end of right humerus, initial encounter for closed fracture: Secondary | ICD-10-CM

## 2014-05-07 DIAGNOSIS — S42209A Unspecified fracture of upper end of unspecified humerus, initial encounter for closed fracture: Secondary | ICD-10-CM

## 2014-05-07 MED ORDER — OXYCODONE-ACETAMINOPHEN 5-325 MG PO TABS: 1.0000 | ORAL_TABLET | ORAL | Status: DC | PRN

## 2014-05-07 NOTE — Patient Instructions (Signed)
Start therapy right shoulder

## 2014-05-07 NOTE — Progress Notes (Signed)
Patient ID: Craig Hopkins, male   DOB: November 02, 1961, 53 y.o.   MRN: [id number]  Chief Complaint  Patient presents with  . Follow-up    1 week recheck on right shoulder fracture with xray. DOI 04-20-14.    17 days post proximal humerus fracture right shoulder. Repeat x-rays today show fracture is in good position healing appropriately  He still having quite a bit of discomfort in his right shoulder. When we decreased his Percocet from 10-5 mg he now has take every 4 hours good relief. Has some proximal ecchymosis under the skin he has good flexion of his elbow good grip strength normal pulse normal sensation and mild palpable tenderness.  Recommend that he start physical therapy program with active assisted range of motion of the right shoulder  Recommend 3-4 week followup.

## 2014-05-10 ENCOUNTER — Ambulatory Visit (HOSPITAL_COMMUNITY)
Admission: RE | Admit: 2014-05-10 | Discharge: 2014-05-10 | Disposition: A | Discharge status: Home / Self Care | Payer: BC Managed Care – PPO | Source: Ambulatory Visit | Attending: Orthopedic Surgery | Admitting: Orthopedic Surgery

## 2014-05-10 DIAGNOSIS — E785 Hyperlipidemia, unspecified: Secondary | ICD-10-CM | POA: Insufficient documentation

## 2014-05-10 DIAGNOSIS — IMO0001 Reserved for inherently not codable concepts without codable children: Secondary | ICD-10-CM | POA: Insufficient documentation

## 2014-05-10 DIAGNOSIS — M6281 Muscle weakness (generalized): Secondary | ICD-10-CM | POA: Insufficient documentation

## 2014-05-10 DIAGNOSIS — M25611 Stiffness of right shoulder, not elsewhere classified: Secondary | ICD-10-CM | POA: Insufficient documentation

## 2014-05-10 DIAGNOSIS — M25519 Pain in unspecified shoulder: Secondary | ICD-10-CM | POA: Insufficient documentation

## 2014-05-10 DIAGNOSIS — M25619 Stiffness of unspecified shoulder, not elsewhere classified: Secondary | ICD-10-CM | POA: Insufficient documentation

## 2014-05-10 NOTE — Evaluation (Signed)
Occupational Therapy Evaluation  Patient Details  Name: Craig Hopkins MRN: [id number] Date of Birth: 04/05/1961  Today's Date: 05/10/2014 Time: 1521-1600 OT Time Calculation (min): 39 min OT eval 1521-1600 39'  Visit#: 1 of 16  Re-eval: 06/07/14  Assessment Diagnosis: Rt proximal humerus fx Next MD Visit: 06/11/14 - Dr Aline Brochure    Past Medical History:  Past Medical History  Diagnosis Date  . HTN (hypertension)   . Panic attack   . GERD (gastroesophageal reflux disease)   . IDDM (insulin dependent diabetes mellitus)    Past Surgical History:  Past Surgical History  Procedure Laterality Date  . Tonsillectomy  2000  . Left wrist  1976  . Heart catherization  2011  . Carotid stent  2008  . Throat polyp removed  1996    Subjective Symptoms/Limitations Symptoms: S: I ultimately would like to be able to do my own therapy at home and not have to come to therapy. Pertinent History: On Apr 20, 2014 patient was running though house chasing his teenage son when he jumped over a couch and fell on right arm sustaining a right proximal humerus fx. X-ray shows fracture is postioned correctly to heal. Patient is wearing sling out in the community only. Presently takes Percocet for pain. Dr. Aline Brochure has referred patient to occupational therapy for evaluation and treatment.  Limitations: Using right arm for daily tasks. writing, self-feeding, raising arm up overhead. Special Tests: FOTO score: 37/100 Patient Stated Goals: to be able to complete therapy at home.  Pain Assessment Currently in Pain?: Yes Pain Score: 2  Pain Location: Shoulder Pain Orientation: Right Pain Type: Acute pain Pain Relieving Factors: Percocet  Precautions/Restrictions  Precautions Precautions: None Restrictions Weight Bearing Restrictions: No  Balance Screening Balance Screen Has the patient fallen in the past 6 months: Yes How many times?: 1 Has the patient had a decrease in activity level because  of a fear of falling? : No Is the patient reluctant to leave their home because of a fear of falling? : No  Prior Functioning  Home Living Family/patient expects to be discharged to:: Private residence Living Arrangements: Spouse/significant other;Children Available Help at Discharge: Family Type of Home: House Prior Function Level of Independence: Independent with basic ADLs;Independent with gait  Able to Take Stairs?: Yes Driving: Yes Vocation: Full time employment Vocation Requirements: works in [employer] Leisure: Hobbies-yes (Comment) Comments: sports and likes to be active  Assessment ADL/Vision/Perception ADL ADL Comments: Difficulty reaching up overhead, writing, self-feeding, dressing and bathing. Dominant Hand: Right Vision - History Baseline Vision: No visual deficits  Cognition/Observation Cognition Overall Cognitive Status: Within Functional Limits for tasks assessed Arousal/Alertness: Awake/alert Orientation Level: Oriented X4  Additional Assessments RUE Assessment RUE Assessment:  (assessed supine. IR/ER adducted) RUE AROM (degrees) Right Shoulder Flexion: 90 Degrees Right Shoulder ABduction: 70 Degrees Right Shoulder Internal Rotation: 85 Degrees Right Shoulder External Rotation: 65 Degrees RUE PROM (degrees) Right Shoulder Flexion: 125 Degrees Right Shoulder ABduction: 90 Degrees Right Shoulder Internal Rotation: 90 Degrees Right Shoulder External Rotation: 70 Degrees LUE Assessment LUE Assessment: Within Functional Limits Palpation Palpation: Max fascial restrictions in right upper arm, trapezius, and scapularis region.      Occupational Therapy Assessment and Plan OT Assessment and Plan Clinical Impression Statement: A: Patient is a 53 y/o male s/p right proximal humerus fx presenting today with increase pain and fascial restrictions and decreased strength and range of motion of the right shoulder resulting in increased difficulty completing daily  acitivites. Patient is requesting to  complete the minimal amount of therapy possible and would like to be able to be discharged to a home exercise program . With my professional opinion, I am suggesting that patient complet therapy to at least 8 weeks.  Pt will benefit from skilled therapeutic intervention in order to improve on the following deficits: Impaired UE functional use;Increased fascial restricitons;Decreased range of motion;Pain;Decreased strength Rehab Potential: Excellent OT Frequency: Min 2X/week OT Duration: 8 weeks OT Treatment/Interventions: Therapeutic activities;Self-care/ADL training;Therapeutic exercise;Manual therapy;Modalities;Patient/family education OT Plan: P: Pt will benefit from skilled OT interventions in order to decrease pain, increase ROM, increase strength, and increase overall RUE functional use. Treatment Plan: PROM, AAROM, and AROM, MFR and manual stretching, scapular strengtheing and proximal stabilization, RUE general strengthening. (progress patient to [facility] as soon as able to tolerate as he has voiced that he does not plan to participate in therapy for longer than 2 weeks.)     Goals Short Term Goals Time to Complete Short Term Goals: 4 weeks Short Term Goal 1: Patient will be educated on HEP.  Short Term Goal 2: Patient will increase PROM to [facility] to increase ability to get dressed with less difficulty.  Short Term Goal 3: Patient will report a pain level of 5/10 when completing daily acitivites. Short Term Goal 4: Patient will increase right shoulder strength to 3+/5 to increase ability to use right UE for functional tasks.  Short Term Goal 5: Patient will decrease fascial restrictions from max to mod amount.  Long Term Goals Time to Complete Long Term Goals: 8 weeks Long Term Goal 1: Patient will return to highest level of independence with all BADL, IADL work and leisure tasks.  Long Term Goal 2: Patient will increase AROM to WNL to increase ability to  reach into overhead cabinets.  Long Term Goal 3: Patient will report a pain level of 3/10 or less when completing daily activities.  Long Term Goal 4: Patient will increase right shoulder strength to 4/5 to increase ability to lift heavy items without difficulty.  Long Term Goal 5: Patient will decrease fascial restrictions from mod to min amount.   Problem List Patient Active Problem List   Diagnosis Date Noted  . Pain in joint, shoulder region 05/10/2014  . Muscle weakness (generalized) 05/10/2014  . Decreased range of motion of right shoulder 05/10/2014  . Closed fracture of right proximal humerus 04/30/2014  . Hyperlipemia 02/23/2012  . Coronary artery disease 02/23/2012  . DIABETES 12/23/2010  . LATERAL EPICONDYLITIS 12/23/2010  . HIGH BLOOD PRESSURE 12/23/2010    End of Session Activity Tolerance: Patient tolerated treatment well General Behavior During Therapy: [facility] for tasks assessed/performed OT Plan of Care OT Home Exercise Plan: towel slides, pendulum, and AROM of wrist and elbow OT Patient Instructions: handout (scanned) Consulted and Agree with Plan of Care: Patient   Ailene Ravel, OTR/L,CBIS   05/10/2014, 4:35 PM  Physician Documentation Your signature is required to indicate approval of the treatment plan as stated above.  Please sign and either send electronically or make a copy of this report for your files and return this physician signed original.  Please mark one 1.__approve of plan  2. ___approve of plan with the following conditions.   ______________________________  _____________________ Physician Signature                                                                                                             Date SBNR

## 2014-05-15 ENCOUNTER — Ambulatory Visit (HOSPITAL_COMMUNITY)
Admission: RE | Admit: 2014-05-15 | Discharge: 2014-05-15 | Disposition: A | Discharge status: Home / Self Care | Payer: BC Managed Care – PPO | Source: Ambulatory Visit | Attending: Endocrinology | Admitting: Endocrinology

## 2014-05-15 NOTE — Progress Notes (Signed)
Occupational Therapy Treatment Patient Details  Name: Craig Hopkins MRN: [id number] Date of Birth: 03-Feb-1961  Today's Date: 05/15/2014 Time: 7782-4235 OT Time Calculation (min): 45 min Manual 3614-4315 (20') Therapeutic Exercises 1625-1650 (25')  Visit#: 2 of 16  Re-eval: 06/07/14    Authorization:    Authorization Time Period:    Authorization Visit#:   of    Subjective Symptoms/Limitations Symptoms: "Becasue I was driving today, I didn't take any pain medication until I came in here." Pain Assessment Currently in Pain?: Yes Pain Score: 3  Pain Location: Shoulder Pain Orientation: Right Pain Type: Acute pain  Exercise/Treatments Supine Protraction: PROM;AAROM;10 reps Horizontal ABduction: PROM;AAROM;10 reps External Rotation: PROM;AAROM;10 reps Internal Rotation: PROM;AAROM;10 reps Flexion: PROM;AAROM;10 reps ABduction: PROM;AAROM;10 reps Seated Elevation: AROM;15 reps Extension: AROM;15 reps Row: AROM;15 reps   Therapy Ball Flexion: 10 reps ABduction: 10 reps ROM / Strengthening / Isometric Strengthening Wall Wash: 1 min Thumb Tacks: 1 min Prot/Ret//Elev/Dep: 1 min   Manual Therapy Manual Therapy: Myofascial release Myofascial Release: Myofascial release and manual stretching to right bicep, anterior shoulder, and upper trap regions to decreased fascial restrictions and promote pain-free movement  Occupational Therapy Assessment and Plan OT Assessment and Plan Clinical Impression Statement: Initiated PROM, AAROM, and ball stretches this session - pt tolerated well all exercises, without significant increases in pain.  Educated pt on supine dowel exercises for home use.  discussed pt's progress, and he is still determined to continue theray at home rather than come to tx sessions.  Suggested one session biweekly to follow up on HEP, and pt verbalized that he would consider the suggestion.  Pt also verbalized that he has not yet done towel slides at home. OT  Plan: Follow up on supine dowel exercises for HEP for pt understanding.  Follow up on use of towel slides at home.   Goals Short Term Goals Short Term Goal 1: Patient will be educated on HEP.  Short Term Goal 1 Progress: Progressing toward goal Short Term Goal 2: Patient will increase PROM to [facility] to increase ability to get dressed with less difficulty.  Short Term Goal 2 Progress: Progressing toward goal Short Term Goal 3: Patient will report a pain level of 5/10 when completing daily acitivites. Short Term Goal 3 Progress: Progressing toward goal Short Term Goal 4: Patient will increase right shoulder strength to 3+/5 to increase ability to use right UE for functional tasks.  Short Term Goal 4 Progress: Progressing toward goal Short Term Goal 5: Patient will decrease fascial restrictions from max to mod amount.  Short Term Goal 5 Progress: Progressing toward goal Long Term Goals Long Term Goal 1: Patient will return to highest level of independence with all BADL, IADL work and leisure tasks.  Long Term Goal 1 Progress: Progressing toward goal Long Term Goal 2: Patient will increase AROM to WNL to increase ability to reach into overhead cabinets.  Long Term Goal 2 Progress: Progressing toward goal Long Term Goal 3: Patient will report a pain level of 3/10 or less when completing daily activities.  Long Term Goal 3 Progress: Progressing toward goal Long Term Goal 4: Patient will increase right shoulder strength to 4/5 to increase ability to lift heavy items without difficulty.  Long Term Goal 4 Progress: Progressing toward goal Long Term Goal 5: Patient will decrease fascial restrictions from mod to min amount.  Long Term Goal 5 Progress: Progressing toward goal  Problem List Patient Active Problem List   Diagnosis Date Noted  . Pain  in joint, shoulder region 05/10/2014  . Muscle weakness (generalized) 05/10/2014  . Decreased range of motion of right shoulder 05/10/2014  . Closed  fracture of right proximal humerus 04/30/2014  . Hyperlipemia 02/23/2012  . Coronary artery disease 02/23/2012  . DIABETES 12/23/2010  . LATERAL EPICONDYLITIS 12/23/2010  . HIGH BLOOD PRESSURE 12/23/2010    End of Session Activity Tolerance: Patient tolerated treatment well General Behavior During Therapy: [facility] for tasks assessed/performed OT Plan of Care OT Home Exercise Plan: supine dowel exercises OT Patient Instructions: Explained and demonstrated. Pt verbalized understanding.  Proovided handout (scanned). Consulted and Agree with Plan of Care: Patient  [place], [place], OTR/L [phone]  05/15/2014, 5:07 PM

## 2014-05-16 ENCOUNTER — Ambulatory Visit (HOSPITAL_COMMUNITY)
Admission: RE | Admit: 2014-05-16 | Discharge: 2014-05-16 | Disposition: A | Discharge status: Home / Self Care | Payer: BC Managed Care – PPO | Source: Ambulatory Visit | Attending: Endocrinology | Admitting: Endocrinology

## 2014-05-16 NOTE — Progress Notes (Signed)
Occupational Therapy Treatment Patient Details  Name: Craig Hopkins MRN: [id number] Date of Birth: [birth year]  Today's Date: 05/16/2014 Time: 8127-5170 OT Time Calculation (min): 33 min MFR 0174-9449 10' Therex 6759-1638 23'  Visit#: 3 of 16  Re-eval: 06/07/14     Subjective Symptoms/Limitations Symptoms: S: The pain woke me up last night. I only take a pain med if I'm coming here.  Pain Assessment Currently in Pain?: Yes Pain Score: 7  (with movement) Pain Location: Shoulder Pain Orientation: Right Pain Type: Acute pain  Precautions/Restrictions  Precautions Precautions: None  Exercise/Treatments Supine Protraction: PROM;5 reps;AAROM;15 reps Horizontal ABduction: PROM;5 reps;AAROM;15 reps External Rotation: PROM;5 reps;AAROM;15 reps Internal Rotation: PROM;5 reps;AAROM;15 reps Flexion: PROM;5 reps;AAROM;15 reps ABduction: PROM;5 reps;AAROM;15 reps Standing Protraction: AAROM;12 reps Horizontal ABduction: AAROM;12 reps External Rotation: AAROM;12 reps Internal Rotation: AAROM;12 reps Flexion: AAROM;12 reps ABduction: AAROM;12 reps ROM / Strengthening / Isometric Strengthening Wall Wash: 1' Proximal Shoulder Strengthening, Supine: 10X  Proximal Shoulder Strengthening, Seated: 10X        Manual Therapy Manual Therapy: Myofascial release Myofascial Release: Myofascial release and manual stretching to right bicep, anterior shoulder, and upper trap regions to decreased fascial restrictions and promote pain-free movement  Occupational Therapy Assessment and Plan OT Assessment and Plan Clinical Impression Statement: A: Patient states at rest he is not in pain and with movement he is at a 7/10 pain level. Patient states that he feels sore and tight from session yesterday. Re-iterated the importance of doing the HEP at home especially if he wants to transition to souly completing a HEP and not attending therapy.  Patient verablized understanding and stated he would  try harder to complete them. Added AAROM standing and proximal shoulder strengthening supine and seated. Patient tolerated well.  OT Plan: P: Attempt AROM supine.    Goals Short Term Goals Short Term Goal 1: Patient will be educated on HEP.  Short Term Goal 2: Patient will increase PROM to [facility] to increase ability to get dressed with less difficulty.  Short Term Goal 3: Patient will report a pain level of 5/10 when completing daily acitivites. Short Term Goal 4: Patient will increase right shoulder strength to 3+/5 to increase ability to use right UE for functional tasks.  Short Term Goal 5: Patient will decrease fascial restrictions from max to mod amount.  Long Term Goals Long Term Goal 1: Patient will return to highest level of independence with all BADL, IADL work and leisure tasks.  Long Term Goal 2: Patient will increase AROM to WNL to increase ability to reach into overhead cabinets.  Long Term Goal 3: Patient will report a pain level of 3/10 or less when completing daily activities.  Long Term Goal 4: Patient will increase right shoulder strength to 4/5 to increase ability to lift heavy items without difficulty.  Long Term Goal 5: Patient will decrease fascial restrictions from mod to min amount.   Problem List Patient Active Problem List   Diagnosis Date Noted  . Pain in joint, shoulder region 05/10/2014  . Muscle weakness (generalized) 05/10/2014  . Decreased range of motion of right shoulder 05/10/2014  . Closed fracture of right proximal humerus 04/30/2014  . Hyperlipemia 02/23/2012  . Coronary artery disease 02/23/2012  . DIABETES 12/23/2010  . LATERAL EPICONDYLITIS 12/23/2010  . HIGH BLOOD PRESSURE 12/23/2010    End of Session Activity Tolerance: Patient tolerated treatment well General Behavior During Therapy: [facility] for tasks assessed/performed   Ailene Ravel, OTR/L,CBIS   05/16/2014, 4:20 PM

## 2014-05-24 ENCOUNTER — Ambulatory Visit (HOSPITAL_COMMUNITY)
Admission: RE | Admit: 2014-05-24 | Discharge: 2014-05-24 | Disposition: A | Discharge status: Home / Self Care | Payer: BC Managed Care – PPO | Source: Ambulatory Visit | Attending: Orthopedic Surgery | Admitting: Orthopedic Surgery

## 2014-05-24 DIAGNOSIS — IMO0001 Reserved for inherently not codable concepts without codable children: Secondary | ICD-10-CM | POA: Diagnosis not present

## 2014-05-24 DIAGNOSIS — M25619 Stiffness of unspecified shoulder, not elsewhere classified: Secondary | ICD-10-CM | POA: Insufficient documentation

## 2014-05-24 DIAGNOSIS — M25519 Pain in unspecified shoulder: Secondary | ICD-10-CM | POA: Diagnosis not present

## 2014-05-24 DIAGNOSIS — E785 Hyperlipidemia, unspecified: Secondary | ICD-10-CM | POA: Insufficient documentation

## 2014-05-24 DIAGNOSIS — M6281 Muscle weakness (generalized): Secondary | ICD-10-CM | POA: Insufficient documentation

## 2014-05-24 NOTE — Evaluation (Addendum)
Occupational Therapy Reassessment and Discharge  Patient Details  Name: Craig Hopkins MRN: [id number] Date of Birth: 1961-10-21  Today's Date: 05/24/2014 Time: 5400-8676 OT Time Calculation (min): 35 min MFR 1950-9326 10' Reassess 1538-1603 25'  Visit#: 4 of 16  Re-eval: 06/07/14  Assessment Diagnosis: Rt proximal humerus fx    Past Medical History:  Past Medical History  Diagnosis Date  . HTN (hypertension)   . Panic attack   . GERD (gastroesophageal reflux disease)   . IDDM (insulin dependent diabetes mellitus)    Past Surgical History:  Past Surgical History  Procedure Laterality Date  . Tonsillectomy  2000  . Left wrist  1976  . Heart catherization  2011  . Carotid stent  2008  . Throat polyp removed  1996    Subjective Symptoms/Limitations Symptoms: S: I was trimming hedges and I played golf.  Special Tests: FOTO score: 75/100 Pain Assessment Currently in Pain?: No/denies  Precautions/Restrictions  Precautions Precautions: None   Assessment  Additional Assessments RUE Assessment RUE Assessment:  (assessed supine and seated. IR/ER adducted) RUE AROM (degrees) Right Shoulder Flexion:  (150/150 (on eval; 90 supine)) Right Shoulder ABduction:  (165/141 (on eval: 70 supine)) Right Shoulder Internal Rotation:  (90/90 (on eval: 85 supine)) Right Shoulder External Rotation:  (77/70 (on eval: 65 supine)) Palpation Palpation: Min fascial restrictions in right upper arm, trapezius, scapularis region.      Exercise/Treatments Standing ABduction: Theraband;10 reps Theraband Level (Shoulder ABduction): Level 2 (Red) Extension: Theraband;10 reps Theraband Level (Shoulder Extension): Level 2 (Red) Row: Theraband;10 reps Theraband Level (Shoulder Row): Level 2 (Red)    Manual Therapy Manual Therapy: Myofascial release Myofascial Release: Myofascial release and manual stretching to right bicep, anterior shoulder, and upper trap regions to decreased fascial  restrictions and promote pain-free movement  Occupational Therapy Assessment and Plan OT Assessment and Plan Clinical Impression Statement: A: Reassessment completed this date as patient feels he is capable of completing therapy independently at home. Patient also states that he will be unable to attend therapy for the next 2 weeks due to travel for work and vacation so discharge seems appropriate due to busy lifestyle. Patient has made progress towards therapy goals. All STGs have been met. Patient has met 2/5 LTGs. Patient was given an updated HEP and educated on exercises. Patient verbalized and demonstrated understanding. Patient is agreeable to be discharged from therapy.  OT Plan: P: D/C from therapy.    Goals Short Term Goals Time to Complete Short Term Goals: 4 weeks Short Term Goal 1: Patient will be educated on HEP.  Short Term Goal 1 Progress: Met Short Term Goal 2: Patient will increase PROM to [facility] to increase ability to get dressed with less difficulty.  Short Term Goal 2 Progress: Met Short Term Goal 3: Patient will report a pain level of 5/10 when completing daily acitivites. Short Term Goal 3 Progress: Met Short Term Goal 4: Patient will increase right shoulder strength to 3+/5 to increase ability to use right UE for functional tasks.  Short Term Goal 4 Progress: Met Short Term Goal 5: Patient will decrease fascial restrictions from max to mod amount.  Short Term Goal 5 Progress: Met Long Term Goals Time to Complete Long Term Goals: 8 weeks Long Term Goal 1: Patient will return to highest level of independence with all BADL, IADL work and leisure tasks.  Long Term Goal 1 Progress: Not met Long Term Goal 2: Patient will increase AROM to WNL to increase ability to reach into  overhead cabinets.  Long Term Goal 2 Progress: Met Long Term Goal 3: Patient will report a pain level of 3/10 or less when completing daily activities.  Long Term Goal 3 Progress: Not met Long Term Goal  4: Patient will increase right shoulder strength to 4/5 to increase ability to lift heavy items without difficulty.  Long Term Goal 4 Progress: Not met Long Term Goal 5: Patient will decrease fascial restrictions from mod to min amount.  Long Term Goal 5 Progress: Met  Problem List Patient Active Problem List   Diagnosis Date Noted  . Pain in joint, shoulder region 05/10/2014  . Muscle weakness (generalized) 05/10/2014  . Decreased range of motion of right shoulder 05/10/2014  . Closed fracture of right proximal humerus 04/30/2014  . Hyperlipemia 02/23/2012  . Coronary artery disease 02/23/2012  . DIABETES 12/23/2010  . LATERAL EPICONDYLITIS 12/23/2010  . HIGH BLOOD PRESSURE 12/23/2010    End of Session Activity Tolerance: Patient tolerated treatment well General Behavior During Therapy: [facility] for tasks assessed/performed OT Plan of Care OT Home Exercise Plan: strengthening and theraband exercises OT Patient Instructions: Explained and demonstrated. Pt verbalized understanding.  Provided handout (scanned). Consulted and Agree with Plan of Care: Patient   Ailene Ravel, OTR/L,CBIS   05/24/2014, 4:23 PM  Physician Documentation Your signature is required to indicate approval of the treatment plan as stated above.  Please sign and either send electronically or make a copy of this report for your files and return this physician signed original.  Please mark one 1.__approve of plan  2. ___approve of plan with the following conditions.   ______________________________                                                          _____________________ Physician Signature                                                                                                             Date SBNR

## 2014-06-11 ENCOUNTER — Encounter: Payer: Self-pay | Admitting: Orthopedic Surgery

## 2014-06-11 ENCOUNTER — Encounter: Payer: BC Managed Care – PPO | Admitting: Orthopedic Surgery

## 2014-06-13 NOTE — Addendum Note (Signed)
Encounter addended by: Debby Bud, OT on: 06/13/2014 11:55 AM<BR>     Documentation filed: Episodes

## 2014-09-27 ENCOUNTER — Ambulatory Visit (INDEPENDENT_AMBULATORY_CARE_PROVIDER_SITE_OTHER): Payer: BC Managed Care – PPO | Admitting: Cardiovascular Disease

## 2014-09-27 ENCOUNTER — Encounter: Payer: Self-pay | Admitting: Cardiovascular Disease

## 2014-09-27 VITALS — BP 130/88 | HR 73 | Ht 69.0 in | Wt 198.1 lb

## 2014-09-27 DIAGNOSIS — I2510 Atherosclerotic heart disease of native coronary artery without angina pectoris: Secondary | ICD-10-CM

## 2014-09-27 DIAGNOSIS — E785 Hyperlipidemia, unspecified: Secondary | ICD-10-CM

## 2014-09-27 NOTE — Assessment & Plan Note (Signed)
Craig Hopkins  is doing fairly well. He has a history of PCI in the past. He has some atypical pains but none of them sound consistent with unstable angina.  I have encouraged him to stop smoking completely. Encouraged him to continue to exercise on regular basis. I'll see him again in 6 months. We'll check fasting lipids, BMP , and liver enzymes at that time.

## 2014-09-27 NOTE — Progress Notes (Signed)
Craig Hopkins Date of Birth  1961-10-18 [place] [address]    [place]   [place], [place]  [address]    [place],   [id number] [phone]  Fax  [phone]  [phone]  Fax [phone]  Problem List: 1. CAD - s/p stenting of his LAD 2. Diabetes Mellitus 3. Hypertension  History of Present Illness:  Craig Hopkins is a 53 y.o. gentleman with a hx as noted above.  He has continued to have intermittant cp.  He has some dyspnea with exertion. His last cath was 2011 which revealed a patent LAD stent.  He's had some dyspnea on exertion but he admits that he has not been exercising at all. He still smokes a few cigarettes a week.  Nov. 5, 2015:  Craig Hopkins is doing well. He is still smoking  In May he broke his shoulder (broke his humerous).  He had been running before them.  Started taking pain pills frequently. Lots of stress issues (family issues)  Through these strees, he started smoking more, exercising less.  Still has the same chest pain.  Lots of pain in the left arm.   Now, he has started running on the treadmill on a more regular basis.    He has some shoulder pain which resolves during his run.    Current Outpatient Prescriptions on File Prior to Visit  Medication Sig Dispense Refill  . ALPRAZolam (XANAX) 0.25 MG tablet     . Amlodipine Besy-Benazepril HCl (LOTREL PO) Take by mouth daily.      Marland Kitchen aspirin 325 MG tablet Take 325 mg by mouth daily.      Marland Kitchen atorvastatin (LIPITOR) 80 MG tablet Take 0.5 tablets (40 mg total) by mouth daily. 30 tablet 1  . BuPROPion HCl (WELLBUTRIN PO) Take by mouth daily.    . clopidogrel (PLAVIX) 75 MG tablet Take 75 mg by mouth daily.      . hydrochlorothiazide (HYDRODIURIL) 25 MG tablet Take 25 mg by mouth daily.      Marland Kitchen OMEPRAZOLE PO Take by mouth daily.    Marland Kitchen oxyCODONE-acetaminophen (PERCOCET/ROXICET) 5-325 MG per tablet Take 1 tablet by mouth every 4 (four) hours as needed for severe  pain. 42 tablet 0   No current facility-administered medications on file prior to visit.    No Known Allergies  Past Medical History  Diagnosis Date  . HTN (hypertension)   . Panic attack   . GERD (gastroesophageal reflux disease)   . IDDM (insulin dependent diabetes mellitus)     Past Surgical History  Procedure Laterality Date  . Tonsillectomy  2000  . Left wrist  1976  . Heart catherization  2011  . Carotid stent  2008  . Throat polyp removed  1996    History  Smoking status  . Smoker, Current Status Unknown  Smokeless tobacco  . Not on file    History  Alcohol Use  . 0.5 oz/week  . 1 drink(s) per week    Family History  Problem Relation Age of Onset  . Cancer      family history   . Coronary artery disease      family history   . Coronary artery disease Father   . Stroke Father   . Breast cancer Mother 15    Reviw of Systems:  Reviewed in the HPI.  All other systems are negative.  Physical Exam: Blood pressure 130/88, pulse 73, height  5\' 9"  (1.753 m), weight 198 lb 1.9 oz (89.867 kg). General: Well developed, well nourished, in no acute distress.  Head: Normocephalic, atraumatic, sclera non-icteric, mucus membranes are moist,   Neck: Supple. Carotids are 2 + without bruits. No JVD  Lungs: Clear bilaterally to auscultation.  Heart: regular rate.  normal  S1 S2. No murmurs, gallops or rubs.  Abdomen: Soft, non-tender, non-distended with normal bowel sounds. No hepatomegaly. No rebound/guarding. No masses.  Msk:  Strength and tone are normal  Extremities: No clubbing or cyanosis. No edema.  Distal pedal pulses are 2+ and equal bilaterally.  Neuro: Alert and oriented X 3. Moves all extremities spontaneously.  Psych:  Responds to questions appropriately with a normal affect.  ECG: Nov. 5, 2015:  NSR at 73.  Normal ECG  Assessment / Plan:

## 2014-09-27 NOTE — Patient Instructions (Signed)

## 2016-01-08 ENCOUNTER — Encounter: Payer: Self-pay | Admitting: Cardiovascular Disease

## 2016-01-09 ENCOUNTER — Ambulatory Visit (INDEPENDENT_AMBULATORY_CARE_PROVIDER_SITE_OTHER): Payer: BLUE CROSS/BLUE SHIELD | Admitting: Cardiovascular Disease

## 2016-01-09 ENCOUNTER — Encounter: Payer: Self-pay | Admitting: Cardiovascular Disease

## 2016-01-09 VITALS — BP 108/84 | HR 68 | Ht 69.0 in | Wt 209.1 lb

## 2016-01-09 DIAGNOSIS — I25810 Atherosclerosis of coronary artery bypass graft(s) without angina pectoris: Secondary | ICD-10-CM

## 2016-01-09 MED ORDER — ASPIRIN EC 81 MG PO TBEC: 81.0000 mg | DELAYED_RELEASE_TABLET | Freq: Every day | ORAL | Status: AC

## 2016-01-09 NOTE — Patient Instructions (Addendum)
Medication Instructions:  DECREASE Aspirin to 81 mg once daily   Labwork: None Ordered   Testing/Procedures: None Ordered   Follow-Up: Your physician wants you to follow-up in: 1 year with Dr. Nahser.  You will receive a reminder letter in the mail two months in advance. If you don't receive a letter, please call our office to schedule the follow-up appointment.   If you need a refill on your cardiac medications before your next appointment, please call your pharmacy.   Thank you for choosing [facility]! Wilmore Holsomback, RN [phone]    

## 2016-01-09 NOTE — Progress Notes (Signed)
Craig Hopkins Date of Birth  10-Oct-1961 [place] [address]    [place]   [place], [place]  [address]    [place], [place]  [address] [phone]  Fax  [phone]  [phone]  Fax [phone]  Problem List: 1. CAD - s/p stenting of his LAD 2. Diabetes Mellitus 3. Hypertension  History of Present Illness:  Craig Hopkins is a 55 y.o. gentleman with a hx as noted above.  He has continued to have intermittant cp.  He has some dyspnea with exertion. His last cath was 2011 which revealed a patent LAD stent.  He's had some dyspnea on exertion but he admits that he has not been exercising at all. He still smokes a few cigarettes a week.  Nov. 5, 2015:  Craig Hopkins is doing well. He is still smoking  In May he broke his shoulder (broke his humerous).  He had been running before them.  Started taking pain pills frequently. Lots of stress issues (family issues)  Through these strees, he started smoking more, exercising less.  Still has the same chest pain.  Lots of pain in the left arm.   Now, he has started running on the treadmill on a more regular basis.    He has some shoulder pain which resolves during his run.    Feb. 16, 2017  Still having CP . Still lots of family issues. Still has chest pain .  These have continued.   Not any different than the previous CP .   Still eating poorly .  Still smoking and drinking excessively .   HbA1c - 6.9,     Current Outpatient Prescriptions on File Prior to Visit  Medication Sig Dispense Refill  . ALPRAZolam (XANAX) 0.25 MG tablet Take 0.25 mg by mouth at bedtime as needed for anxiety or sleep.     . Amlodipine Besy-Benazepril HCl (LOTREL PO) Take 1 tablet by mouth daily.     Marland Kitchen aspirin 325 MG tablet Take 325 mg by mouth daily.      Marland Kitchen atorvastatin (LIPITOR) 80 MG tablet Take 0.5 tablets (40 mg total) by mouth daily. 30 tablet 1  . BuPROPion HCl (WELLBUTRIN PO) Take 1 tablet by mouth daily.      . clopidogrel (PLAVIX) 75 MG tablet Take 75 mg by mouth daily.      . hydrochlorothiazide (HYDRODIURIL) 25 MG tablet Take 25 mg by mouth daily.      . insulin aspart (NOVOLOG) 100 UNIT/ML injection Inject 40 Units into the skin 3 (three) times daily before meals. 40-50 UNITS SLIDING SCALE    . Insulin Human (INSULIN PUMP) SOLN Inject into the skin.    Marland Kitchen OMEPRAZOLE PO Take 1 tablet by mouth daily.     Marland Kitchen oxyCODONE-acetaminophen (PERCOCET/ROXICET) 5-325 MG per tablet Take 1 tablet by mouth every 4 (four) hours as needed for severe pain. 42 tablet 0   No current facility-administered medications on file prior to visit.    No Known Allergies  Past Medical History  Diagnosis Date  . HTN (hypertension)   . Panic attack   . GERD (gastroesophageal reflux disease)   . IDDM (insulin dependent diabetes mellitus) [facility])     Past Surgical History  Procedure Laterality Date  . Tonsillectomy  2000  . Left wrist  1976  . Heart catherization  2011  . Carotid stent  2008  . Throat polyp removed  1996  History  Smoking status  . Smoker, Current Status Unknown  Smokeless tobacco  . Not on file    History  Alcohol Use  . 0.5 oz/week  . 1 drink(s) per week    Family History  Problem Relation Age of Onset  . Cancer      family history   . Coronary artery disease      family history   . Coronary artery disease Father   . Stroke Father   . Breast cancer Mother 68    Reviw of Systems:  Reviewed in the HPI.  All other systems are negative.  Physical Exam: Blood pressure 108/84, pulse 68, height 5\' 9"  (1.753 m), weight 209 lb 1.9 oz (94.856 kg). General: Well developed, well nourished, in no acute distress. Head: Normocephalic, atraumatic, sclera non-icteric, mucus membranes are moist,  Neck: Supple. Carotids are 2 + without bruits. No JVD Lungs: Clear bilaterally to auscultation. Heart: regular rate.  normal  S1 S2. No murmurs, gallops or rubs. Abdomen: Soft, non-tender,  non-distended with normal bowel sounds. No hepatomegaly. No rebound/guarding. No masses. Msk:  Strength and tone are normal Extremities: No clubbing or cyanosis. No edema.  Distal pedal pulses are 2+ and equal bilaterally. Neuro: Alert and oriented X 3. Moves all extremities spontaneously. Psych:  Responds to questions appropriately with a normal affect.  ECG: 01/09/2016: Normal sinus rhythm at 60. EKG is normal.  Assessment / Plan:   1. CAD - s/p stenting of his LAD  - he still has pain in his shoulders and chest .   Has a new 26 year girlfriend.  No CP .    His symptoms have not changed. I dont think he needs any additional testing at this point but would consider additional testing if he develops additional symptoms. Will reduced his ASA to 81 mg a day .   He had minimal CP with his presenting symptoms .    2. Diabetes Mellitus  3. Hypertension - BP is well controlled.   4. Hyperlipidemia:   Managed by Dr. Launa Grill, Wonda Cheng, MD  01/09/2016 9:38 AM    [place],  [place], [place]  [address] Pager [phone] Phone: [phone]; Fax: [phone]   [facility]  [address] [place], [place]  [address] [phone]   Fax [phone]

## 2016-03-02 DIAGNOSIS — E10319 Type 1 diabetes mellitus with unspecified diabetic retinopathy without macular edema: Secondary | ICD-10-CM | POA: Diagnosis not present

## 2016-03-02 DIAGNOSIS — R1084 Generalized abdominal pain: Secondary | ICD-10-CM | POA: Diagnosis not present

## 2016-03-05 DIAGNOSIS — E109 Type 1 diabetes mellitus without complications: Secondary | ICD-10-CM | POA: Diagnosis not present

## 2016-05-11 DIAGNOSIS — E109 Type 1 diabetes mellitus without complications: Secondary | ICD-10-CM | POA: Diagnosis not present

## 2016-07-06 DIAGNOSIS — E109 Type 1 diabetes mellitus without complications: Secondary | ICD-10-CM | POA: Diagnosis not present

## 2016-11-03 DIAGNOSIS — E109 Type 1 diabetes mellitus without complications: Secondary | ICD-10-CM | POA: Diagnosis not present

## 2017-05-03 DIAGNOSIS — J209 Acute bronchitis, unspecified: Secondary | ICD-10-CM | POA: Diagnosis not present

## 2017-06-10 DIAGNOSIS — E109 Type 1 diabetes mellitus without complications: Secondary | ICD-10-CM | POA: Diagnosis not present

## 2017-06-11 DIAGNOSIS — F410 Panic disorder [episodic paroxysmal anxiety] without agoraphobia: Secondary | ICD-10-CM | POA: Diagnosis not present

## 2017-06-11 DIAGNOSIS — K3184 Gastroparesis: Secondary | ICD-10-CM | POA: Diagnosis not present

## 2017-06-11 DIAGNOSIS — I10 Essential (primary) hypertension: Secondary | ICD-10-CM | POA: Diagnosis not present

## 2017-06-11 DIAGNOSIS — Z1389 Encounter for screening for other disorder: Secondary | ICD-10-CM | POA: Diagnosis not present

## 2017-06-11 DIAGNOSIS — E10319 Type 1 diabetes mellitus with unspecified diabetic retinopathy without macular edema: Secondary | ICD-10-CM | POA: Diagnosis not present

## 2017-06-11 DIAGNOSIS — E113599 Type 2 diabetes mellitus with proliferative diabetic retinopathy without macular edema, unspecified eye: Secondary | ICD-10-CM | POA: Diagnosis not present

## 2017-06-11 DIAGNOSIS — E784 Other hyperlipidemia: Secondary | ICD-10-CM | POA: Diagnosis not present

## 2017-06-11 DIAGNOSIS — N08 Glomerular disorders in diseases classified elsewhere: Secondary | ICD-10-CM | POA: Diagnosis not present

## 2017-06-30 DIAGNOSIS — M7502 Adhesive capsulitis of left shoulder: Secondary | ICD-10-CM | POA: Diagnosis not present

## 2017-08-13 DIAGNOSIS — E109 Type 1 diabetes mellitus without complications: Secondary | ICD-10-CM | POA: Diagnosis not present

## 2017-08-13 DIAGNOSIS — Z794 Long term (current) use of insulin: Secondary | ICD-10-CM | POA: Diagnosis not present

## 2017-09-17 DIAGNOSIS — E109 Type 1 diabetes mellitus without complications: Secondary | ICD-10-CM | POA: Diagnosis not present

## 2017-09-27 DIAGNOSIS — Z794 Long term (current) use of insulin: Secondary | ICD-10-CM | POA: Diagnosis not present

## 2017-09-27 DIAGNOSIS — E109 Type 1 diabetes mellitus without complications: Secondary | ICD-10-CM | POA: Diagnosis not present

## 2017-11-08 DIAGNOSIS — Z794 Long term (current) use of insulin: Secondary | ICD-10-CM | POA: Diagnosis not present

## 2017-11-08 DIAGNOSIS — E109 Type 1 diabetes mellitus without complications: Secondary | ICD-10-CM | POA: Diagnosis not present

## 2017-12-29 DIAGNOSIS — Z1331 Encounter for screening for depression: Secondary | ICD-10-CM | POA: Diagnosis not present

## 2017-12-29 DIAGNOSIS — I2510 Atherosclerotic heart disease of native coronary artery without angina pectoris: Secondary | ICD-10-CM | POA: Diagnosis not present

## 2017-12-29 DIAGNOSIS — E10319 Type 1 diabetes mellitus with unspecified diabetic retinopathy without macular edema: Secondary | ICD-10-CM | POA: Diagnosis not present

## 2017-12-29 DIAGNOSIS — I10 Essential (primary) hypertension: Secondary | ICD-10-CM | POA: Diagnosis not present

## 2017-12-29 DIAGNOSIS — Z1389 Encounter for screening for other disorder: Secondary | ICD-10-CM | POA: Diagnosis not present

## 2017-12-29 DIAGNOSIS — H3520 Other non-diabetic proliferative retinopathy, unspecified eye: Secondary | ICD-10-CM | POA: Diagnosis not present

## 2018-02-22 DIAGNOSIS — E109 Type 1 diabetes mellitus without complications: Secondary | ICD-10-CM | POA: Diagnosis not present

## 2018-05-10 DIAGNOSIS — N08 Glomerular disorders in diseases classified elsewhere: Secondary | ICD-10-CM | POA: Diagnosis not present

## 2018-05-10 DIAGNOSIS — E10319 Type 1 diabetes mellitus with unspecified diabetic retinopathy without macular edema: Secondary | ICD-10-CM | POA: Diagnosis not present

## 2018-05-10 DIAGNOSIS — H3520 Other non-diabetic proliferative retinopathy, unspecified eye: Secondary | ICD-10-CM | POA: Diagnosis not present

## 2018-05-10 DIAGNOSIS — I10 Essential (primary) hypertension: Secondary | ICD-10-CM | POA: Diagnosis not present

## 2018-05-10 DIAGNOSIS — E7849 Other hyperlipidemia: Secondary | ICD-10-CM | POA: Diagnosis not present

## 2018-05-29 DIAGNOSIS — R55 Syncope and collapse: Secondary | ICD-10-CM | POA: Diagnosis not present

## 2018-05-29 DIAGNOSIS — R0789 Other chest pain: Secondary | ICD-10-CM | POA: Diagnosis not present

## 2018-05-29 DIAGNOSIS — M25512 Pain in left shoulder: Secondary | ICD-10-CM | POA: Diagnosis not present

## 2018-05-29 DIAGNOSIS — M25511 Pain in right shoulder: Secondary | ICD-10-CM | POA: Diagnosis not present

## 2018-05-30 DIAGNOSIS — S40012A Contusion of left shoulder, initial encounter: Secondary | ICD-10-CM | POA: Diagnosis not present

## 2018-05-30 DIAGNOSIS — S2232XA Fracture of one rib, left side, initial encounter for closed fracture: Secondary | ICD-10-CM | POA: Diagnosis not present

## 2018-05-30 DIAGNOSIS — M25512 Pain in left shoulder: Secondary | ICD-10-CM | POA: Diagnosis not present

## 2018-07-05 DIAGNOSIS — E109 Type 1 diabetes mellitus without complications: Secondary | ICD-10-CM | POA: Diagnosis not present

## 2018-09-20 DIAGNOSIS — I10 Essential (primary) hypertension: Secondary | ICD-10-CM | POA: Diagnosis not present

## 2018-09-20 DIAGNOSIS — E10319 Type 1 diabetes mellitus with unspecified diabetic retinopathy without macular edema: Secondary | ICD-10-CM | POA: Diagnosis not present

## 2018-09-20 DIAGNOSIS — R55 Syncope and collapse: Secondary | ICD-10-CM | POA: Diagnosis not present

## 2018-11-14 DIAGNOSIS — E109 Type 1 diabetes mellitus without complications: Secondary | ICD-10-CM | POA: Diagnosis not present

## 2019-02-02 DIAGNOSIS — E108 Type 1 diabetes mellitus with unspecified complications: Secondary | ICD-10-CM | POA: Diagnosis not present

## 2019-02-02 DIAGNOSIS — H3520 Other non-diabetic proliferative retinopathy, unspecified eye: Secondary | ICD-10-CM | POA: Diagnosis not present

## 2019-02-02 DIAGNOSIS — E10319 Type 1 diabetes mellitus with unspecified diabetic retinopathy without macular edema: Secondary | ICD-10-CM | POA: Diagnosis not present

## 2019-02-02 DIAGNOSIS — I10 Essential (primary) hypertension: Secondary | ICD-10-CM | POA: Diagnosis not present

## 2019-02-02 DIAGNOSIS — R55 Syncope and collapse: Secondary | ICD-10-CM | POA: Diagnosis not present

## 2019-02-02 DIAGNOSIS — I2510 Atherosclerotic heart disease of native coronary artery without angina pectoris: Secondary | ICD-10-CM | POA: Diagnosis not present

## 2019-04-07 DIAGNOSIS — E109 Type 1 diabetes mellitus without complications: Secondary | ICD-10-CM | POA: Diagnosis not present

## 2019-07-17 DIAGNOSIS — H524 Presbyopia: Secondary | ICD-10-CM | POA: Diagnosis not present

## 2019-07-17 DIAGNOSIS — E109 Type 1 diabetes mellitus without complications: Secondary | ICD-10-CM | POA: Diagnosis not present

## 2019-07-17 DIAGNOSIS — H40013 Open angle with borderline findings, low risk, bilateral: Secondary | ICD-10-CM | POA: Diagnosis not present

## 2019-07-17 DIAGNOSIS — H2513 Age-related nuclear cataract, bilateral: Secondary | ICD-10-CM | POA: Diagnosis not present

## 2019-08-10 ENCOUNTER — Other Ambulatory Visit: Payer: Self-pay

## 2019-08-10 DIAGNOSIS — R6889 Other general symptoms and signs: Secondary | ICD-10-CM | POA: Diagnosis not present

## 2019-08-10 DIAGNOSIS — Z20822 Contact with and (suspected) exposure to covid-19: Secondary | ICD-10-CM

## 2019-08-12 LAB — NOVEL CORONAVIRUS, NAA: SARS-CoV-2, NAA: NOT DETECTED

## 2019-08-30 DIAGNOSIS — E109 Type 1 diabetes mellitus without complications: Secondary | ICD-10-CM | POA: Diagnosis not present

## 2019-09-22 ENCOUNTER — Other Ambulatory Visit: Payer: Self-pay

## 2019-09-22 DIAGNOSIS — Z20822 Contact with and (suspected) exposure to covid-19: Secondary | ICD-10-CM

## 2019-09-23 LAB — NOVEL CORONAVIRUS, NAA: SARS-CoV-2, NAA: NOT DETECTED

## 2019-09-25 ENCOUNTER — Telehealth: Payer: Self-pay | Admitting: Endocrinology

## 2019-09-25 NOTE — Telephone Encounter (Signed)
Negative COVID results given. Patient results "NOT Detected." Caller expressed understanding. ° °

## 2019-10-18 ENCOUNTER — Telehealth: Payer: Self-pay

## 2019-10-18 NOTE — Telephone Encounter (Signed)
Called pt to set up OV w/ NA 11/30, left message asking pt to call the office.

## 2019-11-22 DIAGNOSIS — E109 Type 1 diabetes mellitus without complications: Secondary | ICD-10-CM | POA: Diagnosis not present

## 2020-02-03 ENCOUNTER — Ambulatory Visit: Payer: BLUE CROSS/BLUE SHIELD | Attending: Internal Medicine

## 2020-02-03 DIAGNOSIS — Z23 Encounter for immunization: Secondary | ICD-10-CM

## 2020-02-03 NOTE — Progress Notes (Signed)
   Covid-19 Vaccination Clinic  Name:  Craig Hopkins    MRN: [id number] DOB: [birth year]  02/03/2020  [name] was observed post Covid-19 immunization for 15 minutes without incident. He was provided with Vaccine Information Sheet and instruction to access the V-Safe system.   [name] was instructed to call 911 with any severe reactions post vaccine: Marland Kitchen Difficulty breathing  . Swelling of face and throat  . A fast heartbeat  . A bad rash all over body  . Dizziness and weakness   Immunizations Administered    Name Date Dose VIS Date Route   Moderna COVID-19 Vaccine 02/03/2020 11:00 AM 0.5 mL 10/24/2019 Intramuscular   Manufacturer: Moderna   Lot: [id number]   [id number]

## 2020-03-04 DIAGNOSIS — H40013 Open angle with borderline findings, low risk, bilateral: Secondary | ICD-10-CM | POA: Diagnosis not present

## 2020-03-05 ENCOUNTER — Encounter: Payer: Self-pay | Admitting: Cardiovascular Disease

## 2020-03-05 ENCOUNTER — Other Ambulatory Visit: Payer: Self-pay

## 2020-03-05 ENCOUNTER — Ambulatory Visit (INDEPENDENT_AMBULATORY_CARE_PROVIDER_SITE_OTHER): Payer: BC Managed Care – PPO | Admitting: Cardiovascular Disease

## 2020-03-05 VITALS — BP 130/70 | HR 73 | Ht 69.0 in | Wt 221.0 lb

## 2020-03-05 DIAGNOSIS — E109 Type 1 diabetes mellitus without complications: Secondary | ICD-10-CM

## 2020-03-05 DIAGNOSIS — I10 Essential (primary) hypertension: Secondary | ICD-10-CM | POA: Diagnosis not present

## 2020-03-05 DIAGNOSIS — I2510 Atherosclerotic heart disease of native coronary artery without angina pectoris: Secondary | ICD-10-CM

## 2020-03-05 NOTE — Patient Instructions (Signed)
Medication Instructions:  Your physician recommends that you continue on your current medications as directed. Please refer to the Current Medication list given to you today.  *If you need a refill on your cardiac medications before your next appointment, please call your pharmacy*   Lab Work: TODAY - Hgb A1C, BMET, Lipid, Liver If you have labs (blood work) drawn today and your tests are completely normal, you will receive your results only by: Marland Kitchen MyChart Message (if you have MyChart) OR . A paper copy in the mail If you have any lab test that is abnormal or we need to change your treatment, we will call you to review the results.    Testing/Procedures: None Ordered   Follow-Up: At [facility], you and your health needs are our priority.  As part of our continuing mission to provide you with exceptional heart care, we have created designated Provider Care Teams.  These Care Teams include your primary Cardiologist (physician) and Advanced Practice Providers (APPs -  Physician Assistants and Nurse Practitioners) who all work together to provide you with the care you need, when you need it.  We recommend signing up for the patient portal called "MyChart".  Sign up information is provided on this After Visit Summary.  MyChart is used to connect with patients for Virtual Visits (Telemedicine).  Patients are able to view lab/test results, encounter notes, upcoming appointments, etc.  Non-urgent messages can be sent to your provider as well.   To learn more about what you can do with MyChart, go to [email or web address].    Your next appointment:   6 month(s)  The format for your next appointment:   In Person  Provider:   You may see [name], MD or one of the following Advanced Practice Providers on your designated Care Team:    Richardson Dopp, PA-C  [place], [place]  Daune Perch, [place]

## 2020-03-05 NOTE — Progress Notes (Signed)
Craig Hopkins Date of Birth  [birth year] [facility]        [address]    [place]     [place], [place]  [address]       [phone]     Problem List: 1. CAD - s/p stenting of his LAD 2. Diabetes Mellitus 3. Hypertension     Craig Hopkins is a 59 y.o. gentleman with a hx as noted above.  He has continued to have intermittant cp.  He has some dyspnea with exertion. His last cath was 2011 which revealed a patent LAD stent.  He's had some dyspnea on exertion but he admits that he has not been exercising at all. He still smokes a few cigarettes a week.  Nov. 5, 2015:  Craig Hopkins is doing well. He is still smoking  In May he broke his shoulder (broke his humerous).  He had been running before them.  Started taking pain pills frequently. Lots of stress issues (family issues)  Through these strees, he started smoking more, exercising less.  Still has the same chest pain.  Lots of pain in the left arm.   Now, he has started running on the treadmill on a more regular basis.    He has some shoulder pain which resolves during his run.    Feb. 16, 2017  Still having CP . Still lots of family issues. Still has chest pain .  These have continued.   Not any different than the previous CP .   Still eating poorly .  Still smoking and drinking excessively .   HbA1c - 6.9,    March 05, 2020: Craig Hopkins  is seen today after a 4-year absence.  He has a history of coronary artery disease with stenting of his left anterior descending artery. Has been through a divorce since I last saw him. Stopped smoking but is vaping   Has been trying to exercise   Walking while plyaing golf this past week   Had some mild DOE that was out of the ordinary  He had some CP prior to his previous stenting   Getting the 2nd  Maderna vaccine tomorrow ,  Is  A bit concerned about this     Current Outpatient Medications on File Prior to Visit  Medication Sig Dispense Refill  . ALPRAZolam (XANAX) 0.25 MG tablet  Take 0.25 mg by mouth at bedtime as needed for anxiety or sleep.     . Amlodipine Besy-Benazepril HCl (LOTREL PO) Take 1 tablet by mouth daily.     Marland Kitchen aspirin EC 81 MG tablet Take 1 tablet (81 mg total) by mouth daily.    Marland Kitchen atorvastatin (LIPITOR) 40 MG tablet Take 40 mg by mouth daily.    . clopidogrel (PLAVIX) 75 MG tablet Take 75 mg by mouth daily.      . cyclobenzaprine (FLEXERIL) 10 MG tablet as needed.    . hydrochlorothiazide (HYDRODIURIL) 25 MG tablet Take 25 mg by mouth daily.      . insulin aspart (NOVOLOG) 100 UNIT/ML injection Inject 40 Units into the skin 3 (three) times daily before meals. 40-50 UNITS SLIDING SCALE    . Insulin Human (INSULIN PUMP) SOLN Inject into the skin.    Marland Kitchen OMEPRAZOLE PO Take 1 tablet by mouth daily.     . tadalafil (CIALIS) 5 MG tablet Take 5 mg by mouth daily.     No current facility-administered medications on file prior to visit.    No Known Allergies  Past Medical History:  Diagnosis Date  . GERD (gastroesophageal reflux disease)   . HTN (hypertension)   . IDDM (insulin dependent diabetes mellitus)   . Panic attack     Past Surgical History:  Procedure Laterality Date  . CAROTID STENT  2008  . heart catherization  2011  . left wrist  1976  . throat polyp removed  1996  . TONSILLECTOMY  2000    Social History   Tobacco Use  Smoking Status Former Smoker  Smokeless Tobacco Former Systems developer    Social History   Substance and Sexual Activity  Alcohol Use Yes  . Alcohol/week: 1.0 standard drinks  . Types: 1 Standard drinks or equivalent per week    Family History  Problem Relation Age of Onset  . Cancer Unknown        family history   . Coronary artery disease Unknown        family history   . Coronary artery disease Father   . Stroke Father   . Breast cancer Mother 27    Reviw of Systems:  Reviewed in the HPI.  All other systems are negative.   Physical Exam: Blood pressure 130/70, pulse 73, height 5\' 9"  (1.753 m), weight  221 lb (100.2 kg), SpO2 97 %. Body mass index is 32.64 kg/m.  GEN:  Middle age male,  Moderately obese ,  NAD  HEENT: Normal NECK: No JVD; No carotid bruits LYMPHATICS: No lymphadenopathy CARDIAC: RRR  RESPIRATORY:  Clear to auscultation without rales, wheezing or rhonchi  ABDOMEN: Soft, non-tender, non-distended MUSCULOSKELETAL:  No edema; No deformity  SKIN: Warm and dry NEUROLOGIC:  Alert and oriented x 3   ECG: March 05, 2020: Normal sinus rhythm at 73.  No ST or T wave changes.    Assessment / Plan:   1. CAD - s/p stenting of his LAD  - .  He is gotten out of shape over the past year.  I encouraged him to get back into a walking program.  He wanted to jog but I think that walking would be better.  I encouraged him to walk 3 or 4 times a week approximately 3 miles a day each time.  I will see him again in 6 months and will reassess how he is feeling.  If he has any episodes of angina he is to call me right away.     2. Diabetes Mellitus: Its been over a year since he saw his diabetic doctor.  We will check a hemoglobin A1c.  3. Hypertension -blood pressures well controlled.  Continue current medications.  4. Hyperlipidemia:    Continue atorvastatin 80 mg a day.  We will check labs including lipid profile, liver enzymes, basic metabolic profile today.   [name], MD  03/05/2020 2:55 PM    [place] [facility],  [place], [place]  [address] Pager [phone] Phone: [phone]; Fax: [phone]

## 2020-03-06 ENCOUNTER — Ambulatory Visit: Payer: BC Managed Care – PPO | Attending: Internal Medicine

## 2020-03-06 DIAGNOSIS — Z23 Encounter for immunization: Secondary | ICD-10-CM

## 2020-03-06 LAB — LIPID PANEL
Chol/HDL Ratio: 2.5 ratio (ref 0.0–5.0)
Cholesterol, Total: 147 mg/dL (ref 100–199)
HDL: 60 mg/dL (ref >39)
LDL Chol Calc (NIH): 66 mg/dL (ref 0–99)
Triglycerides: 122 mg/dL (ref 0–149)
VLDL Cholesterol Cal: 21 mg/dL (ref 5–40)

## 2020-03-06 LAB — BASIC METABOLIC PANEL
BUN/Creatinine Ratio: 25 — ABNORMAL HIGH (ref 9–20)
BUN: 19 mg/dL (ref 6–24)
CO2: 28 mmol/L (ref 20–29)
Calcium: 9.3 mg/dL (ref 8.7–10.2)
Chloride: 97 mmol/L (ref 96–106)
Creatinine, Ser: 0.77 mg/dL (ref 0.76–1.27)
GFR calc Af Amer: 116 mL/min/{1.73_m2} (ref >59)
GFR calc non Af Amer: 101 mL/min/{1.73_m2} (ref >59)
Glucose: 185 mg/dL — ABNORMAL HIGH (ref 65–99)
Potassium: 4 mmol/L (ref 3.5–5.2)
Sodium: 138 mmol/L (ref 134–144)

## 2020-03-06 LAB — HEPATIC FUNCTION PANEL
ALT: 27 IU/L (ref 0–44)
AST: 27 IU/L (ref 0–40)
Albumin: 4.4 g/dL (ref 3.8–4.9)
Alkaline Phosphatase: 72 IU/L (ref 39–117)
Bilirubin Total: 0.9 mg/dL (ref 0.0–1.2)
Bilirubin, Direct: 0.24 mg/dL (ref 0.00–0.40)
Total Protein: 6.4 g/dL (ref 6.0–8.5)

## 2020-03-06 LAB — HEMOGLOBIN A1C
Est. average glucose Bld gHb Est-mCnc: 174 mg/dL
Hgb A1c MFr Bld: 7.7 % — ABNORMAL HIGH (ref 4.8–5.6)

## 2020-03-06 NOTE — Progress Notes (Signed)
   Covid-19 Vaccination Clinic  Name:  Craig Hopkins    MRN: [id number] DOB: 02/28/1961  03/06/2020  Mr. [name] was observed post Covid-19 immunization for 15 minutes without incident. He was provided with Vaccine Information Sheet and instruction to access the V-Safe system.   Mr. [name] was instructed to call 911 with any severe reactions post vaccine: Marland Kitchen Difficulty breathing  . Swelling of face and throat  . A fast heartbeat  . A bad rash all over body  . Dizziness and weakness   Immunizations Administered    Name Date Dose VIS Date Route   Moderna COVID-19 Vaccine 03/06/2020 10:47 AM 0.5 mL 10/24/2019 Intramuscular   Manufacturer: Moderna   Lot: [id number]   [id number]

## 2020-05-22 DIAGNOSIS — I10 Essential (primary) hypertension: Secondary | ICD-10-CM | POA: Diagnosis not present

## 2020-05-22 DIAGNOSIS — E785 Hyperlipidemia, unspecified: Secondary | ICD-10-CM | POA: Diagnosis not present

## 2020-05-22 DIAGNOSIS — E10319 Type 1 diabetes mellitus with unspecified diabetic retinopathy without macular edema: Secondary | ICD-10-CM | POA: Diagnosis not present

## 2020-05-22 DIAGNOSIS — E669 Obesity, unspecified: Secondary | ICD-10-CM | POA: Diagnosis present

## 2020-05-22 DIAGNOSIS — I2510 Atherosclerotic heart disease of native coronary artery without angina pectoris: Secondary | ICD-10-CM | POA: Diagnosis not present

## 2020-06-12 DIAGNOSIS — E109 Type 1 diabetes mellitus without complications: Secondary | ICD-10-CM | POA: Diagnosis not present

## 2020-09-06 DIAGNOSIS — H40013 Open angle with borderline findings, low risk, bilateral: Secondary | ICD-10-CM | POA: Diagnosis not present

## 2020-09-06 DIAGNOSIS — H524 Presbyopia: Secondary | ICD-10-CM | POA: Diagnosis not present

## 2020-09-06 DIAGNOSIS — E109 Type 1 diabetes mellitus without complications: Secondary | ICD-10-CM | POA: Diagnosis not present

## 2020-09-06 DIAGNOSIS — H2513 Age-related nuclear cataract, bilateral: Secondary | ICD-10-CM | POA: Diagnosis not present

## 2020-09-25 DIAGNOSIS — K3184 Gastroparesis: Secondary | ICD-10-CM | POA: Diagnosis not present

## 2020-09-25 DIAGNOSIS — E10319 Type 1 diabetes mellitus with unspecified diabetic retinopathy without macular edema: Secondary | ICD-10-CM | POA: Diagnosis not present

## 2020-09-25 DIAGNOSIS — F1010 Alcohol abuse, uncomplicated: Secondary | ICD-10-CM | POA: Diagnosis not present

## 2020-10-11 DIAGNOSIS — E109 Type 1 diabetes mellitus without complications: Secondary | ICD-10-CM | POA: Diagnosis not present

## 2020-12-23 ENCOUNTER — Encounter: Payer: Self-pay | Admitting: Cardiovascular Disease

## 2020-12-23 NOTE — Progress Notes (Signed)
Craig Hopkins Date of Birth  [birth year] [facility]        [address]    [place]     [place], [place]  [address]       [phone]     Problem List: 1. CAD - s/p stenting of his LAD 2. Diabetes Mellitus 3. Hypertension     Craig Hopkins is a 60 y.o. gentleman with a hx as noted above.  He has continued to have intermittant cp.  He has some dyspnea with exertion. His last cath was 2011 which revealed a patent LAD stent.  He's had some dyspnea on exertion but he admits that he has not been exercising at all. He still smokes a few cigarettes a week.  Nov. 5, 2015:  [name] is doing well. He is still smoking  In May he broke his shoulder (broke his humerous).  He had been running before them.  Started taking pain pills frequently. Lots of stress issues (family issues)  Through these strees, he started smoking more, exercising less.  Still has the same chest pain.  Lots of pain in the left arm.   Now, he has started running on the treadmill on a more regular basis.    He has some shoulder pain which resolves during his run.    Feb. 16, 2017  Still having CP . Still lots of family issues. Still has chest pain .  These have continued.   Not any different than the previous CP .   Still eating poorly .  Still smoking and drinking excessively .   HbA1c - 6.9,    March 05, 2020: Craig Hopkins  is seen today after a 4-year absence.  He has a history of coronary artery disease with stenting of his left anterior descending artery. Has been through a divorce since I last saw him. Stopped smoking but is vaping   Has been trying to exercise   Walking while plyaing golf this past week   Had some mild DOE that was out of the ordinary  He had some CP prior to his previous stenting   Getting the 2nd  Maderna vaccine tomorrow ,  Is  A bit concerned about this   Feb. 1, 2022: Craig Hopkins is seen today for follow up visit for his CAD, DM, HLD  Wt is 224 lbs.  Has done well.  Was waking  regularly  Has not been walking recently  - 3.5 - 4 miles a day .  Was not having any chest pain  Diabetes is fairly well controlled.  Has lost a few lbs.  Still doing some pushups Wants to start an exercise program    Current Outpatient Medications on File Prior to Visit  Medication Sig Dispense Refill  . ALPRAZolam (XANAX) 0.25 MG tablet Take 0.25 mg by mouth at bedtime as needed for anxiety or sleep.     Marland Kitchen amLODipine-benazepril (LOTREL) 5-10 MG capsule Take 1 capsule by mouth daily.    Marland Kitchen aspirin EC 81 MG tablet Take 1 tablet (81 mg total) by mouth daily.    Marland Kitchen atorvastatin (LIPITOR) 40 MG tablet Take 40 mg by mouth daily.    . clopidogrel (PLAVIX) 75 MG tablet Take 75 mg by mouth daily.    . cyclobenzaprine (FLEXERIL) 10 MG tablet as needed.    . hydrochlorothiazide (HYDRODIURIL) 25 MG tablet Take 25 mg by mouth daily.    . insulin aspart (NOVOLOG) 100 UNIT/ML injection Inject 40 Units into the skin  3 (three) times daily before meals. 40-50 UNITS SLIDING SCALE    . Insulin Human (INSULIN PUMP) SOLN Inject into the skin.    Marland Kitchen OMEPRAZOLE PO Take 1 tablet by mouth daily.     . tadalafil (CIALIS) 5 MG tablet Take 5 mg by mouth daily.     No current facility-administered medications on file prior to visit.    No Known Allergies  Past Medical History:  Diagnosis Date  . GERD (gastroesophageal reflux disease)   . HTN (hypertension)   . IDDM (insulin dependent diabetes mellitus)   . Panic attack     Past Surgical History:  Procedure Laterality Date  . CAROTID STENT  2008  . heart catherization  2011  . left wrist  1976  . throat polyp removed  1996  . TONSILLECTOMY  2000    Social History   Tobacco Use  Smoking Status Former Smoker  Smokeless Tobacco Former Systems developer    Social History   Substance and Sexual Activity  Alcohol Use Yes  . Alcohol/week: 1.0 standard drink  . Types: 1 Standard drinks or equivalent per week    Family History  Problem Relation Age of Onset   . Cancer Other        family history   . Coronary artery disease Other        family history   . Coronary artery disease Father   . Stroke Father   . Breast cancer Mother 59    Reviw of Systems:  Reviewed in the HPI.  All other systems are negative.   Physical Exam: Blood pressure 122/80, pulse 69, height 5\' 9"  (1.753 m), weight 224 lb (101.6 kg), SpO2 96 %.  GEN:  Well nourished, well developed in no acute distress HEENT: Normal NECK: No JVD; No carotid bruits LYMPHATICS: No lymphadenopathy CARDIAC: RRR , no murmurs, rubs, gallops RESPIRATORY:  Clear to auscultation without rales, wheezing or rhonchi  ABDOMEN: Soft, non-tender, non-distended MUSCULOSKELETAL:  No edema; No deformity  SKIN: Warm and dry NEUROLOGIC:  Alert and oriented x 3   ECG:  December 24, 2020: Normal sinus rhythm at 69.  No ST or T wave changes.    Assessment / Plan:   1. CAD - s/p stenting of his LAD  - .  [name] is doing well.  Is not had any episodes of chest pain.  He exercises periodically.  I encouraged him to start a good cardio exercise program.  2. Diabetes Mellitus:  Diabetes is fairly well controlled.  Continue current medications.  He will continue to follow-up with Dr. Forde Dandy.  3. Hypertension -blood pressure is well controlled.  Continue current medications. .  4. Hyperlipidemia:    His lipids are managed by Dr. Forde Dandy.  Continue current medications.  His LDL goal is between 50 and 70.   [name], MD  12/24/2020 3:55 PM    [facility] [place],  [place], [place]  [address] Pager [phone] Phone: [phone]; Fax: [phone]

## 2020-12-24 ENCOUNTER — Ambulatory Visit (INDEPENDENT_AMBULATORY_CARE_PROVIDER_SITE_OTHER): Payer: BC Managed Care – PPO | Admitting: Cardiovascular Disease

## 2020-12-24 ENCOUNTER — Other Ambulatory Visit: Payer: Self-pay

## 2020-12-24 ENCOUNTER — Encounter: Payer: Self-pay | Admitting: Cardiovascular Disease

## 2020-12-24 VITALS — BP 122/80 | HR 69 | Ht 69.0 in | Wt 224.0 lb

## 2020-12-24 DIAGNOSIS — I10 Essential (primary) hypertension: Secondary | ICD-10-CM | POA: Diagnosis not present

## 2020-12-24 DIAGNOSIS — I2510 Atherosclerotic heart disease of native coronary artery without angina pectoris: Secondary | ICD-10-CM

## 2020-12-24 DIAGNOSIS — E782 Mixed hyperlipidemia: Secondary | ICD-10-CM | POA: Diagnosis not present

## 2020-12-24 NOTE — Patient Instructions (Signed)
Medication Instructions:  Your physician recommends that you continue on your current medications as directed. Please refer to the Current Medication list given to you today. *If you need a refill on your cardiac medications before your next appointment, please call your pharmacy*   Lab Work: None today If you have labs (blood work) drawn today and your tests are completely normal, you will receive your results only by: . MyChart Message (if you have MyChart) OR . A paper copy in the mail If you have any lab test that is abnormal or we need to change your treatment, we will call you to review the results.   Testing/Procedures: None ordered    Follow-Up: At [facility], you and your health needs are our priority.  As part of our continuing mission to provide you with exceptional heart care, we have created designated Provider Care Teams.  These Care Teams include your primary Cardiologist (physician) and Advanced Practice Providers (APPs -  Physician Assistants and Nurse Practitioners) who all work together to provide you with the care you need, when you need it.  We recommend signing up for the patient portal called "MyChart".  Sign up information is provided on this After Visit Summary.  MyChart is used to connect with patients for Virtual Visits (Telemedicine).  Patients are able to view lab/test results, encounter notes, upcoming appointments, etc.  Non-urgent messages can be sent to your provider as well.   To learn more about what you can do with MyChart, go to [email or web address].    Your next appointment:   1 year(s)  The format for your next appointment:   In Person  Provider:   You may see Philip Nahser, MD or one of the following Advanced Practice Providers on your designated Care Team:    Scott Weaver, PA-C  Vin Bhagat, PA-C   

## 2020-12-26 NOTE — Addendum Note (Signed)
Addended by: Georgiann Cocker on: 12/26/2020 01:21 PM   Modules accepted: Orders

## 2021-02-28 DIAGNOSIS — E109 Type 1 diabetes mellitus without complications: Secondary | ICD-10-CM | POA: Diagnosis not present

## 2021-03-21 DIAGNOSIS — I10 Essential (primary) hypertension: Secondary | ICD-10-CM | POA: Diagnosis not present

## 2021-03-21 DIAGNOSIS — I2510 Atherosclerotic heart disease of native coronary artery without angina pectoris: Secondary | ICD-10-CM | POA: Diagnosis not present

## 2021-03-21 DIAGNOSIS — E10319 Type 1 diabetes mellitus with unspecified diabetic retinopathy without macular edema: Secondary | ICD-10-CM | POA: Diagnosis not present

## 2021-03-27 DIAGNOSIS — Z1211 Encounter for screening for malignant neoplasm of colon: Secondary | ICD-10-CM | POA: Diagnosis not present

## 2021-03-27 DIAGNOSIS — Z1212 Encounter for screening for malignant neoplasm of rectum: Secondary | ICD-10-CM | POA: Diagnosis not present

## 2021-05-13 ENCOUNTER — Encounter: Payer: Self-pay | Admitting: Internal Medicine

## 2021-06-13 DIAGNOSIS — E109 Type 1 diabetes mellitus without complications: Secondary | ICD-10-CM | POA: Diagnosis not present

## 2021-06-19 ENCOUNTER — Ambulatory Visit: Payer: BC Managed Care – PPO | Admitting: Internal Medicine

## 2021-06-19 ENCOUNTER — Encounter: Payer: Self-pay | Admitting: Internal Medicine

## 2021-06-19 ENCOUNTER — Telehealth: Payer: Self-pay

## 2021-06-19 VITALS — BP 156/80 | HR 75 | Ht 69.0 in | Wt 222.0 lb

## 2021-06-19 DIAGNOSIS — Z789 Other specified health status: Secondary | ICD-10-CM

## 2021-06-19 DIAGNOSIS — R195 Other fecal abnormalities: Secondary | ICD-10-CM

## 2021-06-19 DIAGNOSIS — K219 Gastro-esophageal reflux disease without esophagitis: Secondary | ICD-10-CM

## 2021-06-19 DIAGNOSIS — E109 Type 1 diabetes mellitus without complications: Secondary | ICD-10-CM

## 2021-06-19 MED ORDER — PLENVU 140 G PO SOLR: 1.0000 | ORAL | 0 refills | Status: DC

## 2021-06-19 NOTE — Progress Notes (Signed)
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  He is sent today by his primary provider Dr. Forde Dandy regarding positive Cologuard testing.  Patient has not had prior GI evaluations.  Patient tells me that he has had longstanding reflux disease for which he takes Prilosec OTC.  Less likely vague dysphagia at times.  Initially he opted for Cologuard testing which returned positive.  He does describe some vague abdominal discomfort which he believes may be related to a hiatal hernia.  He denies melena or hematochezia.  He underwent stent placement to the LAD graft to 10 years ago.  Has been on Plavix since.  Last catheterization 2011.  Ejection fraction 50%.  He manages his diabetes with insulin pump.  He recently had a mild COVID illness.  REVIEW OF SYSTEMS:  All non-GI ROS negative unless otherwise stated in the HPI.  Past Medical History:  Diagnosis Date   CAD (coronary artery disease)    GERD (gastroesophageal reflux disease)    HTN (hypertension)    Hypercholesteremia    Hyperlipidemia    IDDM (insulin dependent diabetes mellitus)    Panic attack     Past Surgical History:  Procedure Laterality Date   CAROTID STENT  2008   heart catherization  2011   left wrist  1976   throat polyp removed  [place]  reports that he has quit smoking. He has quit using smokeless tobacco. He reports current alcohol use of about 1.0 standard drink of alcohol per week. No history on file for drug use.  family history includes Breast cancer (age of onset: 62) in his mother; Cancer in an other family member; Coronary artery disease in his father and another family member; Stroke in his father.  No Known Allergies     PHYSICAL  EXAMINATION: Vital signs: BP (!) 156/80   Pulse 75   Ht '5\' 9"'$  (1.753 m)   Wt 222 lb (100.7 kg)   SpO2 97%   BMI 32.78 kg/m   Constitutional: Overweight but generally well-appearing, no acute distress Psychiatric: alert and oriented x3, cooperative Eyes: extraocular movements intact, anicteric, conjunctiva pink Mouth: oral pharynx moist, no lesions Neck: supple no lymphadenopathy Cardiovascular: heart regular rate and rhythm, no murmur Lungs: clear to auscultation bilaterally Abdomen: soft, nontender, nondistended, no obvious ascites, no peritoneal signs, normal bowel sounds, no organomegaly Rectal: Deferred till colonoscopy Extremities: no clubbing, cyanosis, or lower extremity edema bilaterally Skin: no lesions on visible extremities Neuro: No focal deficits.  Cranial nerves intact  ASSESSMENT:  1.  Positive Cologuard testing.  Rule out colorectal neoplasia 2.  Chronic GERD mild dysphagia.  Rule out Barrett's esophagus. 3.  Multiple significant comorbidities including coronary artery disease with remote stent placement on Plavix. 4.  Insulin requiring diabetes mellitus type 1 diabetic 5.  Obesity   PLAN:  1.  Schedule colonoscopy to evaluate positive Cologuard testing.  The patient is HIGH RISK given his multiple comorbidities and the need to address Plavix therapy as well as insulin.The nature of the procedure, as well as the risks, benefits, and alternatives were carefully and thoroughly reviewed with the patient. Ample time for discussion and questions allowed. The patient understood, was satisfied, and agreed to proceed.  2.  Upper endoscopy.  Patient is  high risk as above.The nature of the procedure, as well as the risks, benefits, and alternatives were carefully and thoroughly reviewed with the patient. Ample time for discussion and questions allowed. The patient understood, was satisfied, and agreed to proceed.  3.  Reflux precautions 4.  PPI to control GERD symptoms. 5.   Patient will hold his Plavix 1 week prior to his procedures.  He will stay on aspirin.  We will confer with cardiology to see if this is acceptable.  I imagine it will be given the remote nature of his stent placement. 6.  Patient will adjust his insulin pump preprocedure 7.  Further recommendations after the above

## 2021-06-19 NOTE — Patient Instructions (Signed)
If you are age 58 or younger, your body mass index should be between 19-25. Your Body mass index is 32.78 kg/m. If this is out of the aformentioned range listed, please consider follow up with your Primary Care Provider.   __________________________________________________________  The [facility] providers would like to encourage you to use [facility] to communicate with providers for non-urgent requests or questions.  Due to long hold times on the telephone, sending your provider a message by [facility] may be a faster and more efficient way to get a response.  Please allow 48 business hours for a response.  Please remember that this is for non-urgent requests.   You have been scheduled for an endoscopy and colonoscopy. Please follow the written instructions given to you at your visit today. Please pick up your prep supplies at the pharmacy within the next 1-3 days. If you use inhalers (even only as needed), please bring them with you on the day of your procedure.  Due to recent changes in healthcare laws, you may see the results of your imaging and laboratory studies on MyChart before your provider has had a chance to review them.  We understand that in some cases there may be results that are confusing or concerning to you. Not all laboratory results come back in the same time frame and the provider may be waiting for multiple results in order to interpret others.  Please give [place] 48 hours in order for your provider to thoroughly review all the results before contacting the office for clarification of your results.   Please continue Aspirin daily prior to procedure.  Per Dr Henrene Pastor, you will manage insulin pump accordingly prior to procedure.  You will be contacted by our office prior to your procedure for directions on holding your Plavix.  If you do not hear from our office 1 week prior to your scheduled procedure, please call [phone] to discuss.   Thank you for entrusting me with your care and  choosing [facility].  Dr Henrene Pastor

## 2021-06-19 NOTE — Telephone Encounter (Signed)
Craig Hopkins. Craig Hopkins is a 60 year old male is requesting preoperative cardiac evaluation for endoscopy/colonoscopy.  He was last seen in the clinic on December 25, 2020.  During that time he continued to do well.  He had lost some weight and was wanting to start an exercise program.  His PMH includes coronary artery disease status post cardiac catheterization 2011 which showed patent LAD stent, hyperlipidemia, and diabetes mellitus.  May his Plavix be held prior to his procedure?  Thank you for your help.  Please direct response to [facility] preop pool.  Craig Hopkins. Craig Benegas NP-C    06/19/2021, 10:39 AM [address] Office [phone] Fax [phone]

## 2021-06-19 NOTE — Telephone Encounter (Signed)
[facility] Pre-operative Risk Assessment     Request for surgical clearance:     Endoscopy Procedure  What type of surgery is being performed?     Endo/Colon  When is this surgery scheduled?     08-06-2021  What type of clearance is required ?   Pharmacy  Are there any medications that need to be held prior to surgery and how long? Plavix x 1 week  Practice name and name of physician performing surgery?      [facility]  What is your office phone and fax number?      Phone- [phone]  [phone]  Anesthesia type (None, local, MAC, general) ?       MAC

## 2021-06-23 NOTE — Telephone Encounter (Signed)
Left message on voicemail that patient has been given clearance to hold Plavix 7 days prior to endoscopy and colonoscopy scheduled for 08-06-2021.  Advised patient by voicemail to take last dose of Plavix on 07-30-2021, and she will be advised when to restart Plavix by Dr Henrene Pastor after the procedure.  Advised patient to call the office with any other questions or concerns.

## 2021-06-25 DIAGNOSIS — R051 Acute cough: Secondary | ICD-10-CM | POA: Diagnosis not present

## 2021-06-25 DIAGNOSIS — E10319 Type 1 diabetes mellitus with unspecified diabetic retinopathy without macular edema: Secondary | ICD-10-CM | POA: Diagnosis not present

## 2021-06-25 NOTE — Telephone Encounter (Signed)
Per Dr. Acie Fredrickson,  Pt is at low risk for his colonoscopy,  He may hold his plavix ( and ASA if he is taking ) for 5-7 days prior to procedure

## 2021-06-26 NOTE — Telephone Encounter (Signed)
Follow up call to patient to verify that he received message regarding clearance to hold Plavix.  Patient is aware of instructions and verbalized understanding.

## 2021-07-22 ENCOUNTER — Telehealth: Payer: Self-pay | Admitting: Internal Medicine

## 2021-07-22 MED ORDER — PLENVU 140 G PO SOLR: 1.0000 | ORAL | 0 refills | Status: DC

## 2021-07-22 NOTE — Telephone Encounter (Signed)
Patient called requesting we resend the Plenvu prep medication over to [facility] because they are saying they do not have it.

## 2021-07-22 NOTE — Telephone Encounter (Signed)
Rx resent to [facility] as requested.

## 2021-07-30 ENCOUNTER — Encounter: Payer: Self-pay | Admitting: Internal Medicine

## 2021-08-01 DIAGNOSIS — E10319 Type 1 diabetes mellitus with unspecified diabetic retinopathy without macular edema: Secondary | ICD-10-CM | POA: Diagnosis not present

## 2021-08-01 DIAGNOSIS — E785 Hyperlipidemia, unspecified: Secondary | ICD-10-CM | POA: Diagnosis not present

## 2021-08-01 DIAGNOSIS — I10 Essential (primary) hypertension: Secondary | ICD-10-CM | POA: Diagnosis not present

## 2021-08-06 ENCOUNTER — Ambulatory Visit (AMBULATORY_SURGERY_CENTER): Payer: BC Managed Care – PPO | Admitting: Internal Medicine

## 2021-08-06 ENCOUNTER — Other Ambulatory Visit: Payer: Self-pay

## 2021-08-06 ENCOUNTER — Encounter: Payer: Self-pay | Admitting: Internal Medicine

## 2021-08-06 VITALS — BP 129/69 | HR 71 | Temp 98.0°F | Resp 14 | Ht 69.0 in | Wt 222.0 lb

## 2021-08-06 DIAGNOSIS — D125 Benign neoplasm of sigmoid colon: Secondary | ICD-10-CM

## 2021-08-06 DIAGNOSIS — D124 Benign neoplasm of descending colon: Secondary | ICD-10-CM | POA: Diagnosis not present

## 2021-08-06 DIAGNOSIS — K219 Gastro-esophageal reflux disease without esophagitis: Secondary | ICD-10-CM | POA: Diagnosis not present

## 2021-08-06 DIAGNOSIS — D122 Benign neoplasm of ascending colon: Secondary | ICD-10-CM | POA: Diagnosis not present

## 2021-08-06 DIAGNOSIS — D123 Benign neoplasm of transverse colon: Secondary | ICD-10-CM | POA: Diagnosis not present

## 2021-08-06 DIAGNOSIS — Z1211 Encounter for screening for malignant neoplasm of colon: Secondary | ICD-10-CM | POA: Diagnosis not present

## 2021-08-06 DIAGNOSIS — R195 Other fecal abnormalities: Secondary | ICD-10-CM

## 2021-08-06 DIAGNOSIS — D120 Benign neoplasm of cecum: Secondary | ICD-10-CM

## 2021-08-06 DIAGNOSIS — K449 Diaphragmatic hernia without obstruction or gangrene: Secondary | ICD-10-CM | POA: Diagnosis not present

## 2021-08-06 MED ORDER — SODIUM CHLORIDE 0.9 % IV SOLN
500.0000 mL | INTRAVENOUS | Status: DC
Start: 2021-08-06 — End: 2021-08-06

## 2021-08-06 NOTE — Op Note (Signed)
[facility] Patient Name: Billie Luebbers Procedure Date: 08/06/2021 7:57 AM MRN: [id number] Endoscopist: Docia Chuck. Henrene Pastor , MD Age: 60 Referring MD:  Date of Birth: Jun 04, 1961 Gender: Male Account #: [id number] Procedure:                Colonoscopy w/ cold snare polypectomy x 18; hot x                            1; submucosal injection Indications:              Positive Cologuard test Medicines:                Monitored Anesthesia Care Procedure:                Pre-Anesthesia Assessment:                           - Prior to the procedure, a History and Physical                            was performed, and patient medications and                            allergies were reviewed. The patient's tolerance of                            previous anesthesia was also reviewed. The risks                            and benefits of the procedure and the sedation                            options and risks were discussed with the patient.                            All questions were answered, and informed consent                            was obtained. Prior Anticoagulants: The patient has                            taken Plavix (clopidogrel), last dose was 7 days                            prior to procedure. ASA Grade Assessment: III - A                            patient with severe systemic disease. After                            reviewing the risks and benefits, the patient was                            deemed in satisfactory condition to undergo the  procedure.                           After obtaining informed consent, the colonoscope                            was passed under direct vision. Throughout the                            procedure, the patient's blood pressure, pulse, and                            oxygen saturations were monitored continuously. The                            Olympus CF-[id number] ([id number]) Colonoscope was                             introduced through the anus and advanced to the the                            cecum, identified by appendiceal orifice and                            ileocecal valve. The ileocecal valve, appendiceal                            orifice, and rectum were photographed. The quality                            of the bowel preparation was excellent. The                            colonoscopy was performed without difficulty. The                            patient tolerated the procedure well. The bowel                            preparation used was Plenvu via split dose                            instruction. Scope In: 8:34:14 AM Scope Out: [id number] Scope Withdrawal Time: 0 hours 28 minutes 13 seconds  Total Procedure Duration: 0 hours 31 minutes 50 seconds  Findings:                 A 15 mm polyp was found in the sigmoid colon. The                            polyp was pedunculated. The polyp was removed with                            a hot snare. Resection and retrieval were complete.  A 9 mm polyp was found in the descending colon. The                            polyp was sessile and had a central depression. The                            polyp was removed with a cold snare. Resection and                            retrieval were complete. Area was tattooed with an                            injection of Spot (carbon black). See images.                           17 additional polyps were found in the descending                            colon, transverse colon, ascending colon and cecum.                            The polyps were 2 to 10 mm in size. These polyps                            were removed with a cold snare. Resection and                            retrieval were complete.                           The exam was otherwise without abnormality on                            direct and retroflexion views. Complications:            No immediate  complications. Estimated blood loss:                            None. Estimated Blood Loss:     Estimated blood loss: none. Impression:               - One 15 mm polyp in the sigmoid colon, removed                            with a hot snare. Resected and retrieved.                           - One 9 mm polyp in the descending colon, removed                            with a hot snare. Resected and retrieved. Tattooed.                           -  Multiple (17 additional) 2 to 10 mm polyps in the                            sigmoid colon, in the descending colon, in the                            transverse colon, in the ascending colon and in the                            cecum, removed with a cold snare. Resected and                            retrieved.                           - The examination was otherwise normal on direct                            and retroflexion views. Recommendation:           - Repeat colonoscopy in 1 year for surveillance, if                            no cancer found.                           - Patient has a contact number available for                            emergencies. The signs and symptoms of potential                            delayed complications were discussed with the                            patient. Return to normal activities tomorrow.                            Written discharge instructions were provided to the                            patient.                           - Resume previous diet.                           - Continue present medications.                           - Await pathology results. Docia Chuck. Henrene Pastor, MD 08/06/2021 9:32:05 AM This report has been signed electronically.

## 2021-08-06 NOTE — Progress Notes (Signed)
HISTORY OF PRESENT ILLNESS:  Craig Hopkins is a 60 y.o. male with multiple medical problems as listed below who was evaluated in the office June 19, 2021 regarding positive Cologuard testing and chronic GERD with mild dysphagia.  See that dictation for details.  There have been no interval clinical changes.  He tolerated his prep  REVIEW OF SYSTEMS:  All non-GI ROS negative. Past Medical History:  Diagnosis Date   CAD (coronary artery disease)    COPD (chronic obstructive pulmonary disease) ([place])    GERD (gastroesophageal reflux disease)    HTN (hypertension)    Hypercholesteremia    Hyperlipidemia    IDDM (insulin dependent diabetes mellitus)    Neuromuscular disorder ([place])    Panic attack     Past Surgical History:  Procedure Laterality Date   CAROTID STENT  2008   heart catherization  2011   left wrist  1976   throat polyp removed  [place]  reports that he has quit smoking. He has quit using smokeless tobacco. He reports current alcohol use of about 1.0 standard drink per week. He reports current drug use. Drug: Marijuana.  family history includes Breast cancer (age of onset: 19) in his mother; Cancer in an other family member; Coronary artery disease in his father and another family member; Stroke in his father.  No Known Allergies     PHYSICAL EXAMINATION:  Vital signs: BP (!) 109/94   Pulse 76   Temp 98 F (36.7 C)   Resp 16   Ht '5\' 9"'$  (1.753 m)   Wt 222 lb (100.7 kg)   SpO2 97%   BMI 32.78 kg/m  General: Well-developed, well-nourished, no acute distress HEENT: Sclerae are anicteric, conjunctiva pink. Oral mucosa intact Lungs: Clear Heart: Regular Abdomen: soft, nontender, nondistended, no obvious ascites, no peritoneal signs, normal bowel sounds. No organomegaly. Extremities: No edema Psychiatric: alert and oriented x3. Cooperative     ASSESSMENT:  1.  Positive Cologuard testing 2.  Chronic  GERD with mild dysphagia   PLAN:  1.  Colonoscopy 2.  Upper endoscopy with possible dilation

## 2021-08-06 NOTE — Op Note (Signed)
[facility] Patient Name: Craig Hopkins Procedure Date: 08/06/2021 7:56 AM MRN: [id number] Endoscopist: Docia Chuck. Henrene Pastor , MD Age: 60 Referring MD:  Date of Birth: 11-04-1961 Gender: Male Account #: [id number] Procedure:                Upper GI endoscopy with biopsies Indications:              Dysphagia, Esophageal reflux Medicines:                Monitored Anesthesia Care Procedure:                Pre-Anesthesia Assessment:                           - Prior to the procedure, a History and Physical                            was performed, and patient medications and                            allergies were reviewed. The patient's tolerance of                            previous anesthesia was also reviewed. The risks                            and benefits of the procedure and the sedation                            options and risks were discussed with the patient.                            All questions were answered, and informed consent                            was obtained. Prior Anticoagulants: The patient has                            taken Plavix (clopidogrel), last dose was 7 days                            prior to procedure. ASA Grade Assessment: III - A                            patient with severe systemic disease. After                            reviewing the risks and benefits, the patient was                            deemed in satisfactory condition to undergo the                            procedure.  After obtaining informed consent, the endoscope was                            passed under direct vision. Throughout the                            procedure, the patient's blood pressure, pulse, and                            oxygen saturations were monitored continuously. The                            Endoscope was introduced through the mouth, and                            advanced to the second part of duodenum. The upper                             GI endoscopy was accomplished without difficulty.                            The patient tolerated the procedure well. Scope In: Scope Out: Findings:                 The esophagus was normal. No Barrett's or stricture.                           The stomach was normal save a sliding hiatal hernia.                           The examined duodenum was grossly normal to the                            second portion. The major ampulla was somewhat                            prominent. Biopsies were taken with a cold forceps                            for histology to rule out normal versus adenoma.                           The cardia and gastric fundus were normal on                            retroflexion. Complications:            No immediate complications. Estimated Blood Loss:     Estimated blood loss: none. Impression:               - Normal esophagus.                           - Normal stomach.                           -  Normal examined duodenum.                           - Prominent ampulla. Biopsied. Recommendation:           - Patient has a contact number available for                            emergencies. The signs and symptoms of potential                            delayed complications were discussed with the                            patient. Return to normal activities tomorrow.                            Written discharge instructions were provided to the                            patient.                           - Resume previous diet.                           - Continue present medications.                           - Await pathology results.                           - Resume Plavix (clopidogrel) at prior dose today. Docia Chuck. Henrene Pastor, MD 08/06/2021 9:40:14 AM This report has been signed electronically.

## 2021-08-06 NOTE — Progress Notes (Signed)
Called to room to assist during endoscopic procedure.  Patient ID and intended procedure confirmed with present staff. Received instructions for my participation in the procedure from the performing physician.  

## 2021-08-06 NOTE — Progress Notes (Signed)
A/ox3, pleased with MAC, report to RN 

## 2021-08-06 NOTE — Patient Instructions (Signed)
Please read handouts provided. Continue present medications. Await pathology results. Resume Plavix ( clopidogrel ) at prior dose today. Repeat colonoscopy in 1 year for screening, if no cancer found.   YOU HAD AN ENDOSCOPIC PROCEDURE TODAY AT [facility]:   Refer to the procedure report that was given to you for any specific questions about what was found during the examination.  If the procedure report does not answer your questions, please call your gastroenterologist to clarify.  If you requested that your care partner not be given the details of your procedure findings, then the procedure report has been included in a sealed envelope for you to review at your convenience later.  YOU SHOULD EXPECT: Some feelings of bloating in the abdomen. Passage of more gas than usual.  Walking can help get rid of the air that was put into your GI tract during the procedure and reduce the bloating. If you had a lower endoscopy (such as a colonoscopy or flexible sigmoidoscopy) you may notice spotting of blood in your stool or on the toilet paper. If you underwent a bowel prep for your procedure, you may not have a normal bowel movement for a few days.  Please Note:  You might notice some irritation and congestion in your nose or some drainage.  This is from the oxygen used during your procedure.  There is no need for concern and it should clear up in a day or so.  SYMPTOMS TO REPORT IMMEDIATELY:  Following lower endoscopy (colonoscopy or flexible sigmoidoscopy):  Excessive amounts of blood in the stool  Significant tenderness or worsening of abdominal pains  Swelling of the abdomen that is new, acute  Fever of 100F or higher  Following upper endoscopy (EGD)  Vomiting of blood or coffee ground material  New chest pain or pain under the shoulder blades  Painful or persistently difficult swallowing  New shortness of breath  Fever of 100F or higher  Black, tarry-looking stools  For  urgent or emergent issues, a gastroenterologist can be reached at any hour by calling [phone]. Do not use MyChart messaging for urgent concerns.    DIET:  We do recommend a small meal at first, but then you may proceed to your regular diet.  Drink plenty of fluids but you should avoid alcoholic beverages for 24 hours.  ACTIVITY:  You should plan to take it easy for the rest of today and you should NOT DRIVE or use heavy machinery until tomorrow (because of the sedation medicines used during the test).    FOLLOW UP: Our staff will call the number listed on your records 48-72 hours following your procedure to check on you and address any questions or concerns that you may have regarding the information given to you following your procedure. If we do not reach you, we will leave a message.  We will attempt to reach you two times.  During this call, we will ask if you have developed any symptoms of COVID 19. If you develop any symptoms (ie: fever, flu-like symptoms, shortness of breath, cough etc.) before then, please call [phone].  If you test positive for Covid 19 in the 2 weeks post procedure, please call and report this information to [place].    If any biopsies were taken you will be contacted by phone or by letter within the next 1-3 weeks.  Please call us at [phone] if you have not heard about the biopsies in 3 weeks.    SIGNATURES/CONFIDENTIALITY:  You and/or your care partner have signed paperwork which will be entered into your electronic medical record.  These signatures attest to the fact that that the information above on your After Visit Summary has been reviewed and is understood.  Full responsibility of the confidentiality of this discharge information lies with you and/or your care-partner.

## 2021-08-08 ENCOUNTER — Telehealth: Payer: Self-pay

## 2021-08-08 NOTE — Telephone Encounter (Signed)
Attempted follow up call back. No answer, left VM.

## 2021-08-08 NOTE — Telephone Encounter (Signed)
  Follow up Call-  Call back number 08/06/2021  Post procedure Call Back phone  # [phone]  Permission to leave phone message Yes  Some recent data might be hidden     Patient questions:  Do you have a fever, pain , or abdominal swelling? No. Pain Score  0 *  Have you tolerated food without any problems? Yes.    Have you been able to return to your normal activities? Yes.    Do you have any questions about your discharge instructions: Diet   No. Medications  No. Follow up visit  No.  Do you have questions or concerns about your Care? No.  Actions: * If pain score is 4 or above: No action needed, pain <4.  Have you developed a fever since your procedure? no  2.   Have you had an respiratory symptoms (SOB or cough) since your procedure? no  3.   Have you tested positive for COVID 19 since your procedure no  4.   Have you had any family members/close contacts diagnosed with the COVID 19 since your procedure?  no   If yes to any of these questions please route to Joylene John, RN and Joella Prince, RN

## 2021-08-13 ENCOUNTER — Encounter: Payer: Self-pay | Admitting: Internal Medicine

## 2021-08-14 ENCOUNTER — Telehealth: Payer: Self-pay

## 2021-08-14 NOTE — Telephone Encounter (Signed)
Pt scheduled for OV with Dr. Rush Landmark on Oct 28th @ 8:30. Pt states that he will call back in regard to the MRI if he wants to schedule prior to the OV.

## 2021-08-14 NOTE — Telephone Encounter (Signed)
-----   Message from Irving Copas., MD sent at 08/13/2021  3:06 PM EDT ----- Regarding: RE: Needs office visit regarding ampullary adenoma JP, Thank you for the consideration and referral. Happy to meet the patient.  He can be scheduled in my next available slot and okay to use my overbook slots to discuss things further with him. I like to have MRI/MRCP abdomen performed versus pancreas protocol CT abdomen (if patient cannot have an MRI performed) as that will help guide me before we pursue potential final EUS/ERCP. If the patient is willing to have the imaging study done before he meets me that will help guide further discussions.  If he wants to wait until clinic visit that is fine. Thanks. GM ----- Message ----- From: Irene Shipper, MD Sent: 08/13/2021   1:36 PM EDT To: [name], RN, Irving Copas., MD Subject: Needs office visit regarding ampullary adeno#  Gabe, I recently saw this patient for positive Cologuard testing.  He was found to have 19 adenomatous and tubulovillous adenomatous polyps on colonoscopy. On upper endoscopy, that same day, he was found to have a prominent ampulla.  Biopsies showed adenoma. He has multiple comorbidities including a remote history of coronary artery disease for which he is on Plavix. I discussed all these findings with the patient by telephone today.  I recommended a routine office follow-up with you to discuss the implication of ampullary adenoma and the subsequent endoscopic approach to its management. Thanks, Barbette Merino, Please arrange for this patient to have a routine office visit with Dr. Rush Landmark. Thanks, JP

## 2021-08-29 ENCOUNTER — Other Ambulatory Visit: Payer: Self-pay

## 2021-08-29 DIAGNOSIS — R198 Other specified symptoms and signs involving the digestive system and abdomen: Secondary | ICD-10-CM

## 2021-08-29 NOTE — Telephone Encounter (Signed)
Spoke with pt and let him know the order is in for the MR/MRCP, pt given the rad scheduling phone number to call and set up the scan on a date that works with his schedule.

## 2021-08-29 NOTE — Telephone Encounter (Signed)
Pt returned a call for Craig Hopkins, but it was regarding setting up the MRI prior to his OV with Dr. Rush Landmark on the 28th.  Please call.  Thank you.

## 2021-09-08 ENCOUNTER — Telehealth: Payer: Self-pay | Admitting: Internal Medicine

## 2021-09-08 NOTE — Telephone Encounter (Signed)
Patient called states he spoke with his insurance and they advised him that the cheapest place for him to do the MRI scan is in [place]. He would like to know if that would be ok.

## 2021-09-08 NOTE — Telephone Encounter (Signed)
Spoke with pt and he states he can get the MRI done in [place] cheaper per his insurance company. Discussed with pt that he will need to let us know the facility fax number to send orders for the scan. Pt states he will call back.

## 2021-09-08 NOTE — Telephone Encounter (Signed)
Pt called back and order faxed to [facility] @[phone]. Also faxed pts insurance and demographic information so they can contact pt regarding scheduling him for MR abd/w wo mrcp. Requested results be faxed to Dr. Rush Landmark at [phone].

## 2021-09-10 ENCOUNTER — Ambulatory Visit (HOSPITAL_COMMUNITY): Payer: BC Managed Care – PPO

## 2021-09-10 NOTE — Telephone Encounter (Signed)
Rec'd fax from [facility] that patient has been scheduled for MR Abd/w wo, MRCP on Friday, 09-12-21 at 2:00 pm in [place]

## 2021-09-12 ENCOUNTER — Encounter: Payer: Self-pay | Admitting: Gastroenterology

## 2021-09-12 DIAGNOSIS — R109 Unspecified abdominal pain: Secondary | ICD-10-CM | POA: Diagnosis not present

## 2021-09-15 ENCOUNTER — Encounter: Payer: Self-pay | Admitting: Gastroenterology

## 2021-09-15 NOTE — Progress Notes (Signed)
Review of outside imaging report  09/12/2021 MRI/MRCP  The liver is normal in signal intensity without focal lesion.  No intrahepatic or extrahepatic biliary ductal dilation.  The gallbladder is normal.  The pancreas, spleen, adrenal glands, and kidneys are unremarkable.  No abdominal lymphadenopathy. Impression of an unremarkable exam.  Then MRI CP images were not obtained as part of the study and therefore detailed evaluation of the biliary system was not able to be performed.  Nonetheless, there is no dilation of the common bile duct to suggest a distal obstructing lesion.  I will have these results scanned into the chart.  We will have the imaging obtained and uploaded into the system  Patient is scheduled for clinic visit to discuss EUS/ERCP with possible endoscopic ampullectomy in the coming weeks.  Justice Britain, MD [facility] Advanced Endoscopy Office # [phone]

## 2021-09-16 ENCOUNTER — Other Ambulatory Visit: Payer: Self-pay

## 2021-09-16 ENCOUNTER — Ambulatory Visit
Admission: RE | Admit: 2021-09-16 | Discharge: 2021-09-16 | Disposition: A | Discharge status: Home / Self Care | Payer: Self-pay | Source: Ambulatory Visit | Attending: Gastroenterology | Admitting: Gastroenterology

## 2021-09-16 DIAGNOSIS — R109 Unspecified abdominal pain: Secondary | ICD-10-CM

## 2021-09-16 NOTE — Progress Notes (Signed)
Outside order for MRI Abd wwo contrast MRCP - coming from [place]

## 2021-09-19 ENCOUNTER — Ambulatory Visit: Payer: BC Managed Care – PPO | Admitting: Gastroenterology

## 2021-09-19 ENCOUNTER — Telehealth: Payer: Self-pay | Admitting: *Deleted

## 2021-09-19 ENCOUNTER — Encounter: Payer: Self-pay | Admitting: Gastroenterology

## 2021-09-19 VITALS — BP 132/78 | HR 86 | Resp 97 | Ht 69.0 in | Wt 224.0 lb

## 2021-09-19 DIAGNOSIS — D135 Benign neoplasm of extrahepatic bile ducts: Secondary | ICD-10-CM

## 2021-09-19 DIAGNOSIS — R933 Abnormal findings on diagnostic imaging of other parts of digestive tract: Secondary | ICD-10-CM

## 2021-09-19 DIAGNOSIS — Z7902 Long term (current) use of antithrombotics/antiplatelets: Secondary | ICD-10-CM

## 2021-09-19 NOTE — Telephone Encounter (Signed)
    Patient Name: Craig Hopkins  DOB: 02-09-1961 MRN: [id number]  Primary Cardiologist: [name], MD  Chart reviewed as part of pre-operative protocol coverage.   He has history of remote PCI/DES to LAD with patent stent on last cath in 2011. As such, would be reasonable to hold plavix 5 days prior to his upcoming colonoscopy with plans to restart when cleared to do so by his gastroenterologist. He should continue aspirin throughout the perioprocedural period.  I will route this recommendation to the requesting party via Epic fax function and remove from pre-op pool.  Please call with questions.  Abigail Butts, PA-C 09/19/2021, 10:07 AM

## 2021-09-19 NOTE — Telephone Encounter (Signed)
[facility] Pre-operative Risk Assessment     Request for surgical clearance:     Endoscopy Procedure  What type of surgery is being performed?     EUS   When is this surgery scheduled?     TBD   What type of clearance is required ?   Pharmacy  Are there any medications that need to be held prior to surgery and how long? 5 days  Practice name and name of physician performing surgery?      [facility]  What is your office phone and fax number?      Phone- [phone]  [phone]  Anesthesia type (None, local, MAC, general) ?       MAC

## 2021-09-19 NOTE — Patient Instructions (Signed)
Call us when you decide to schedule your procedures  You will need labs done at that time (CBC  CMET PT/INR)  Due to recent changes in healthcare laws, you may see the results of your imaging and laboratory studies on MyChart before your provider has had a chance to review them.  We understand that in some cases there may be results that are confusing or concerning to you. Not all laboratory results come back in the same time frame and the provider may be waiting for multiple results in order to interpret others.  Please give [place] 48 hours in order for your provider to thoroughly review all the results before contacting the office for clarification of your results.    If you are age 39 or older, your body mass index should be between 23-30. Your Body mass index is 33.08 kg/m. If this is out of the aforementioned range listed, please consider follow up with your Primary Care Provider.  If you are age 41 or younger, your body mass index should be between 19-25. Your Body mass index is 33.08 kg/m. If this is out of the aformentioned range listed, please consider follow up with your Primary Care Provider.   ________________________________________________________  The [facility] providers would like to encourage you to use [facility] to communicate with providers for non-urgent requests or questions.  Due to long hold times on the telephone, sending your provider a message by [facility] may be a faster and more efficient way to get a response.  Please allow 48 business hours for a response.  Please remember that this is for non-urgent requests.  _______________________________________________________   Thank you for choosing [facility]  Valarie Merino [place]our[place],MD

## 2021-09-19 NOTE — Progress Notes (Signed)
[facility]ISIT   Primary Care Provider Reynold Bowen, [place] [address] [phone]  Referring Provider Dr. Henrene Pastor  Patient Profile: Craig Hopkins is a 59 y.o. male with a pmh significant for CAD (on Plavix), CHF, diabetes, hypertension, hyperlipidemia, GERD, adenomatous colon polyps, ampullary adenoma (left in situ).  The patient presents to the [facility] for an evaluation and management of problem(s) noted below:  Problem List 1. Ampullary adenoma   2. Abnormal endoscopy of upper gastrointestinal tract   3. Long term (current) use of antithrombotics/antiplatelets     History of Present Illness Please see initial consultation note by Dr. Henrene Pastor for full details of HPI.  Interval History The patient recently underwent an upper and lower endoscopy by Dr. Henrene Pastor.  The lower endoscopy was performed due to a positive Cologuard.  The upper endoscopy was performed due to longstanding heartburn/reflux disease with vague dysphagia symptoms.  Upper endoscopy showed evidence of a prominent ampulla but no other significant findings.  Biopsies showed evidence of adenomatous tissue of the ampulla.  It is for this reason that the patient is referred for consideration of endoscopic ampullectomy.  The patient denies any significant issues of abdominal pain or discomfort.  His PPI use has continued and this helps his GERD symptoms at this time.  He previously was able to hold Plavix a week before his endoscopic evaluation by Dr. Henrene Pastor.  Patient is not taking any significant nonsteroidals or BC/Goody powders.  He has never had pancreatitis in the past.  He has never had biliary obstruction that he is aware of.  Patient underwent cross-sectional imaging with an MRI/MRCP abdomen to further evaluate the biliary and pancreatic trees and there was no evidence of abnormality found concerning for obstruction.  The patient has multiple  questions today about different options that may be available for resection of duodenal ampullary adenomas.  GI Review of Systems Positive as above Negative for dysphagia, odynophagia, nausea, vomiting, alteration of bowel habits, melena, hematochezia  Review of Systems General: Denies fevers/chills/weight loss unintentionally Cardiovascular: Denies chest pain Pulmonary: Denies shortness of breath Gastroenterological: See HPI Genitourinary: Denies darkened urine Hematological: Positive for history of easy bruising/bleeding due to longstanding Plavix use Dermatological: Denies jaundice Psychological: Mood is stable   Medications Current Outpatient Medications  Medication Sig Dispense Refill   ALPRAZolam (XANAX) 0.25 MG tablet Take 0.25 mg by mouth at bedtime as needed for anxiety or sleep.      amLODipine-benazepril (LOTREL) 5-10 MG capsule Take 1 capsule by mouth daily.     aspirin EC 81 MG tablet Take 1 tablet (81 mg total) by mouth daily.     atorvastatin (LIPITOR) 40 MG tablet Take 40 mg by mouth daily.     clopidogrel (PLAVIX) 75 MG tablet Take 75 mg by mouth daily.     Continuous Blood Gluc Sensor (FREESTYLE LIBRE 2 SENSOR) MISC USE TO CHECK GLUCOSE DAILY - REPLACE SENSOR EVERY 14 DAYS.     hydrochlorothiazide (HYDRODIURIL) 25 MG tablet Take 25 mg by mouth daily.     insulin aspart (NOVOLOG) 100 UNIT/ML injection Inject 40 Units into the skin 3 (three) times daily before meals. 40-50 UNITS SLIDING SCALE     Insulin Human (INSULIN PUMP) SOLN Inject into the skin.     omeprazole (PRILOSEC) 20 MG capsule Take 20 mg by mouth daily.     tadalafil (CIALIS) 5 MG tablet Take 5 mg by mouth daily.     No current facility-administered  medications for this visit.    Allergies No Known Allergies  Histories Past Medical History:  Diagnosis Date   CAD (coronary artery disease)    COPD (chronic obstructive pulmonary disease) (HCC)    GERD (gastroesophageal reflux disease)    HTN  (hypertension)    Hypercholesteremia    Hyperlipidemia    IDDM (insulin dependent diabetes mellitus)    Neuromuscular disorder (HCC)    Panic attack    Past Surgical History:  Procedure Laterality Date   CAROTID STENT  11/23/2006   COLONOSCOPY     heart catherization  11/23/2009   left wrist  11/23/1974   throat polyp removed  11/23/1994   TONSILLECTOMY  11/23/1998   UPPER GASTROINTESTINAL ENDOSCOPY     Social History   Socioeconomic History   Marital status: Divorced    Spouse name: Not on file   Number of children: Not on file   Years of education: [employer]   Highest education level: Not on file  Occupational History   Not on file  Tobacco Use   Smoking status: Former   Smokeless tobacco: Former  Scientific laboratory technician Use: Every day  Substance and Sexual Activity   Alcohol use: Yes    Alcohol/week: 1.0 standard drink    Types: 1 Standard drinks or equivalent per week   Drug use: Yes    Types: Marijuana    Comment: last use 1 1/2 weeks ago early sep 2022   Sexual activity: Not on file  Other Topics Concern   Not on file  Social History Narrative   Not on file   Social Determinants of Health   Financial Resource Strain: Not on file  Food Insecurity: Not on file  Transportation Needs: Not on file  Physical Activity: Not on file  Stress: Not on file  Social Connections: Not on file  Intimate Partner Violence: Not on file   Family History  Problem Relation Age of Onset   Breast cancer Mother 16   Coronary artery disease Father    Stroke Father    Cancer Other        family history    Coronary artery disease Other        family history    Esophageal cancer Neg Hx    Colon cancer Neg Hx    Rectal cancer Neg Hx    Stomach cancer Neg Hx    Inflammatory bowel disease Neg Hx    Liver disease Neg Hx    Pancreatic cancer Neg Hx    I have reviewed his medical, social, and family history in detail and updated the electronic medical record as necessary.     PHYSICAL EXAMINATION  BP 132/78   Pulse 86   Resp (!) 97   Ht 5\' 9"  (1.753 m)   Wt 224 lb (101.6 kg)   BMI 33.08 kg/m  Wt Readings from Last 3 Encounters:  09/19/21 224 lb (101.6 kg)  08/06/21 222 lb (100.7 kg)  06/19/21 222 lb (100.7 kg)  GEN: NAD, appears stated age, doesn't appear chronically ill PSYCH: Cooperative, without pressured speech EYE: Conjunctivae pink, sclerae anicteric ENT: MMM CV: Nontachycardic RESP: No audible wheezing GI: NABS, soft, protuberant abdomen, rounded, NT/ND, without rebound MSK/EXT: No significant lower extremity edema SKIN: No jaundice NEURO:  Alert & Oriented x 3, no focal deficits   REVIEW OF DATA  I reviewed the following data at the time of this encounter:  GI Procedures and Studies  September 2022 EGD -  Normal esophagus. - Normal stomach. - Normal examined duodenum. - Prominent ampulla. Biopsied.  Pathology Diagnosis - AMPULLARY ADENOMA (LOW-GRADE DYSPLASIA) - NEGATIVE FOR HIGH-GRADE DYSPLASIA OR CARCINOMA  Laboratory Studies  Reviewed those in epic and care everywhere  Imaging Studies  09/12/2021 MRI/MRCP  The liver is normal in signal intensity without focal lesion.  No intrahepatic or extrahepatic biliary ductal dilation.  The gallbladder is normal.  The pancreas, spleen, adrenal glands, and kidneys are unremarkable.  No abdominal lymphadenopathy. Impression of an unremarkable exam.  Then MRI CP images were not obtained as part of the study and therefore detailed evaluation of the biliary system was not able to be performed.  Nonetheless, there is no dilation of the common bile duct to suggest a distal obstructing lesion.   ASSESSMENT  Craig Hopkins is a 60 y.o. male with a pmh significant for CAD (on Plavix), CHF, diabetes, hypertension, hyperlipidemia, GERD, adenomatous colon polyps, ampullary adenoma (left in situ).  The patient is seen today for evaluation and management of:  1. Ampullary adenoma   2. Abnormal  endoscopy of upper gastrointestinal tract   3. Long term (current) use of antithrombotics/antiplatelets    The patient is clinically and hemodynamically stable at this time.  The patient is referred for consideration of endoscopic resection via ampullectomy of an ampullary adenoma.  Based upon the description and endoscopic pictures I do feel that it is reasonable to pursue an Advanced Polypectomy attempt of the polyp/lesion.  We discussed some of the techniques of endoscopic ampullectomy.  The risks of an EUS including intestinal perforation, bleeding, infection, aspiration, and medication effects were discussed as was the possibility it may not give a definitive diagnosis if a biopsy is performed.  When a biopsy of the pancreas is done as part of the EUS, there is an additional risk of pancreatitis at the rate of about 1-2%.  It was explained that procedure related pancreatitis is typically mild, although it can be severe and even life threatening, which is why we do not perform random pancreatic biopsies and only biopsy a lesion/area we feel is concerning enough to warrant the risk.  The risks of an ERCP were discussed at length, including but not limited to the risk of perforation, bleeding, abdominal pain, post-ERCP pancreatitis (while usually mild can be severe and even life threatening).  My approach is such that we attempt both pancreatic duct and biliary duct cannulation if possible with small sphincterotomies to hopefully I have increased chance of access post ampullectomy of stent placement.  If unsuccessful cannulation then proceed with ampullectomy with hope that we are able to find the pancreatic orifice and biliary orifice post procedure with rectal indomethacin no matter what given to decrease risk of post ERCP pancreatitis.  During attempts at endoscopic ampullectomy, the risks of bleeding and perforation/leak and pancreatitis are increased as opposed to normal ERCPs, and that was discussed with  the patient as well.   In addition, I explained that en-bloc resection is ideal but not always possible.  Subsequent short-interval endoscopic evaluation for follow up and potential retreatment of the lesion/area may be necessary.  I did offer, a referral to surgery in order for patient to have opportunity to discuss surgical ampullectomy/intervention prior to finalizing decision for attempt at endoscopic removal.  The patient wants further time to discuss and think about endoscopic ampullectomy versus surgical ampullectomy.  He feels that he will most likely proceed with an endoscopic management but will MyChart or call us within the next few  weeks to let us know.  The patient is aware that after endoscopic attempt at removal of the polyp, it is found that the patient has a complication or that an invasive lesion or malignant lesion is found, or that the polyp continues to recur, the patient is aware and understands that surgery may still be indicated/required.  All patient questions were answered, to the best of my ability, and the patient agrees to the aforementioned plan of action with follow-up as indicated.   PLAN  Patient will call back or MyChart [place] in the next few weeks to let us know if he wants to proceed with scheduling EGD/EUS/ERCP for attempt at endoscopic ampullectomy Preprocedure labs will need to be obtained if that is the case   No orders of the defined types were placed in this encounter.   New Prescriptions   No medications on file   Modified Medications   No medications on file    Planned Follow Up No follow-ups on file.   Total Time in Face-to-Face and in Coordination of Care for patient including independent/personal interpretation/review of prior testing, medical history, examination, medication adjustment, communicating results with the patient directly, and documentation within the EHR is 35 minutes.   Justice Britain, MD [facility] Advanced  Endoscopy Office # [phone]

## 2021-09-22 ENCOUNTER — Telehealth: Payer: Self-pay | Admitting: Gastroenterology

## 2021-09-22 NOTE — Telephone Encounter (Signed)
Inbound call from [facility] with [facility] requesting a call in regards to the scan that was sent for the patient please.

## 2021-09-25 ENCOUNTER — Telehealth: Payer: Self-pay | Admitting: Gastroenterology

## 2021-09-25 DIAGNOSIS — D135 Benign neoplasm of extrahepatic bile ducts: Secondary | ICD-10-CM

## 2021-09-25 NOTE — Telephone Encounter (Signed)
Inbound call from patient. States he is ready to schedule EGD/EUS/ERCP

## 2021-09-26 NOTE — Telephone Encounter (Signed)
Spoke with Craig Hopkins from [employer]. Disc was return to their office by [facility]. She is sending disc back out in the mail to our office attn: Craig Hopkins. Will see if disc can be upload once disc arrives in the mail.

## 2021-09-26 NOTE — Telephone Encounter (Signed)
Left message on machine to call back  

## 2021-09-29 NOTE — Telephone Encounter (Signed)
Left message on machine to call back  

## 2021-09-30 ENCOUNTER — Other Ambulatory Visit: Payer: Self-pay

## 2021-09-30 DIAGNOSIS — D135 Benign neoplasm of extrahepatic bile ducts: Secondary | ICD-10-CM

## 2021-09-30 NOTE — Telephone Encounter (Signed)
The pt has been scheduled for EUS EGD ERCP on 12/08/21 at 9 am with GM.  All info has been mailed and sent to My Chart.  Waiting for insulin pump instructions.  Message left for pt with all appt info and notified all will be mailed and is available via My Chart.

## 2021-10-24 DIAGNOSIS — E109 Type 1 diabetes mellitus without complications: Secondary | ICD-10-CM | POA: Diagnosis not present

## 2021-11-25 ENCOUNTER — Telehealth: Payer: Self-pay

## 2021-11-25 NOTE — Telephone Encounter (Signed)
The pt has been advised of the insulin pump instructions. We also discussed his plavix hold.   All questions answered to the best of my ability.  He does have some questions that he will ask Dr Rush Landmark the day of the procedure.

## 2021-11-25 NOTE — Telephone Encounter (Signed)
Left message on machine to call back  

## 2021-11-25 NOTE — Telephone Encounter (Signed)
-----   Message from Timothy Lasso, RN sent at 09/30/2021 11:46 AM EST ----- Make sure pt has insulin pump instructions

## 2021-11-27 ENCOUNTER — Encounter (HOSPITAL_COMMUNITY): Payer: Self-pay | Admitting: Gastroenterology

## 2021-11-27 NOTE — Progress Notes (Signed)
Attempted to obtain medical history via telephone, unable to reach at this time. I left a voicemail to return pre surgical testing department's phone call.  

## 2021-12-08 ENCOUNTER — Ambulatory Visit (HOSPITAL_COMMUNITY): Payer: BC Managed Care – PPO

## 2021-12-08 ENCOUNTER — Encounter (HOSPITAL_COMMUNITY)
Admission: RE | Disposition: A | Discharge status: Home / Self Care | Payer: Self-pay | Source: Ambulatory Visit | Attending: Gastroenterology

## 2021-12-08 ENCOUNTER — Ambulatory Visit (HOSPITAL_COMMUNITY)
Admission: RE | Admit: 2021-12-08 | Discharge: 2021-12-08 | Disposition: A | Discharge status: Home / Self Care | Payer: BC Managed Care – PPO | Source: Ambulatory Visit | Attending: Gastroenterology | Admitting: Gastroenterology

## 2021-12-08 ENCOUNTER — Encounter (HOSPITAL_COMMUNITY): Payer: Self-pay | Admitting: Gastroenterology

## 2021-12-08 ENCOUNTER — Ambulatory Visit (HOSPITAL_COMMUNITY): Payer: BC Managed Care – PPO | Admitting: Anesthesiology

## 2021-12-08 ENCOUNTER — Other Ambulatory Visit: Payer: Self-pay

## 2021-12-08 DIAGNOSIS — Z955 Presence of coronary angioplasty implant and graft: Secondary | ICD-10-CM | POA: Diagnosis not present

## 2021-12-08 DIAGNOSIS — K838 Other specified diseases of biliary tract: Secondary | ICD-10-CM | POA: Diagnosis not present

## 2021-12-08 DIAGNOSIS — K31A19 Gastric intestinal metaplasia without dysplasia, unspecified site: Secondary | ICD-10-CM | POA: Diagnosis not present

## 2021-12-08 DIAGNOSIS — E119 Type 2 diabetes mellitus without complications: Secondary | ICD-10-CM | POA: Insufficient documentation

## 2021-12-08 DIAGNOSIS — I899 Noninfective disorder of lymphatic vessels and lymph nodes, unspecified: Secondary | ICD-10-CM | POA: Insufficient documentation

## 2021-12-08 DIAGNOSIS — R52 Pain, unspecified: Secondary | ICD-10-CM

## 2021-12-08 DIAGNOSIS — I2510 Atherosclerotic heart disease of native coronary artery without angina pectoris: Secondary | ICD-10-CM | POA: Diagnosis not present

## 2021-12-08 DIAGNOSIS — Z794 Long term (current) use of insulin: Secondary | ICD-10-CM | POA: Insufficient documentation

## 2021-12-08 DIAGNOSIS — G8918 Other acute postprocedural pain: Secondary | ICD-10-CM

## 2021-12-08 DIAGNOSIS — Z9641 Presence of insulin pump (external) (internal): Secondary | ICD-10-CM | POA: Diagnosis not present

## 2021-12-08 DIAGNOSIS — J449 Chronic obstructive pulmonary disease, unspecified: Secondary | ICD-10-CM | POA: Insufficient documentation

## 2021-12-08 DIAGNOSIS — K2950 Unspecified chronic gastritis without bleeding: Secondary | ICD-10-CM | POA: Diagnosis not present

## 2021-12-08 DIAGNOSIS — I10 Essential (primary) hypertension: Secondary | ICD-10-CM | POA: Insufficient documentation

## 2021-12-08 DIAGNOSIS — M1990 Unspecified osteoarthritis, unspecified site: Secondary | ICD-10-CM | POA: Insufficient documentation

## 2021-12-08 DIAGNOSIS — R109 Unspecified abdominal pain: Secondary | ICD-10-CM | POA: Diagnosis not present

## 2021-12-08 DIAGNOSIS — K219 Gastro-esophageal reflux disease without esophagitis: Secondary | ICD-10-CM | POA: Insufficient documentation

## 2021-12-08 DIAGNOSIS — D135 Benign neoplasm of extrahepatic bile ducts: Secondary | ICD-10-CM | POA: Diagnosis not present

## 2021-12-08 DIAGNOSIS — K3189 Other diseases of stomach and duodenum: Secondary | ICD-10-CM | POA: Insufficient documentation

## 2021-12-08 DIAGNOSIS — R1010 Upper abdominal pain, unspecified: Secondary | ICD-10-CM

## 2021-12-08 DIAGNOSIS — Z87891 Personal history of nicotine dependence: Secondary | ICD-10-CM | POA: Insufficient documentation

## 2021-12-08 DIAGNOSIS — K2289 Other specified disease of esophagus: Secondary | ICD-10-CM | POA: Insufficient documentation

## 2021-12-08 DIAGNOSIS — K2970 Gastritis, unspecified, without bleeding: Secondary | ICD-10-CM | POA: Diagnosis not present

## 2021-12-08 DIAGNOSIS — D490 Neoplasm of unspecified behavior of digestive system: Secondary | ICD-10-CM

## 2021-12-08 HISTORY — PX: SUBMUCOSAL LIFTING INJECTION: SHX6855

## 2021-12-08 HISTORY — PX: BILIARY STENT PLACEMENT: SHX5538

## 2021-12-08 HISTORY — PX: BIOPSY: SHX5522

## 2021-12-08 HISTORY — PX: SPHINCTEROTOMY: SHX5544

## 2021-12-08 HISTORY — PX: EUS: SHX5427

## 2021-12-08 HISTORY — PX: ENDOSCOPIC MUCOSAL RESECTION: SHX6839

## 2021-12-08 HISTORY — PX: ENDOSCOPIC RETROGRADE CHOLANGIOPANCREATOGRAPHY (ERCP) WITH PROPOFOL: SHX5810

## 2021-12-08 HISTORY — PX: BILIARY DILATION: SHX6850

## 2021-12-08 HISTORY — PX: ESOPHAGOGASTRODUODENOSCOPY (EGD) WITH PROPOFOL: SHX5813

## 2021-12-08 HISTORY — PX: HEMOSTASIS CONTROL: SHX6838

## 2021-12-08 HISTORY — PX: SPHINCTEROTOMY: SHX5279

## 2021-12-08 LAB — GLUCOSE, CAPILLARY: Glucose-Capillary: 219 mg/dL — ABNORMAL HIGH (ref 70–99)

## 2021-12-08 SURGERY — ESOPHAGOGASTRODUODENOSCOPY (EGD) WITH PROPOFOL
Anesthesia: General

## 2021-12-08 MED ORDER — LIDOCAINE 2% (20 MG/ML) 5 ML SYRINGE
INTRAMUSCULAR | Status: DC | PRN
  Administered 2021-12-08: 60 mg via INTRAVENOUS

## 2021-12-08 MED ORDER — FENTANYL CITRATE (PF) 100 MCG/2ML IJ SOLN
25.0000 ug | INTRAMUSCULAR | Status: DC | PRN
  Administered 2021-12-08: 25 ug via INTRAVENOUS

## 2021-12-08 MED ORDER — SODIUM CHLORIDE 0.9 % IV SOLN: INTRAVENOUS | Status: DC

## 2021-12-08 MED ORDER — FENTANYL CITRATE (PF) 100 MCG/2ML IJ SOLN
25.0000 ug | Freq: Once | INTRAMUSCULAR | Status: AC
  Administered 2021-12-08: 25 ug via INTRAVENOUS

## 2021-12-08 MED ORDER — SUGAMMADEX SODIUM 500 MG/5ML IV SOLN
INTRAVENOUS | Status: DC | PRN
  Administered 2021-12-08: 400 mg via INTRAVENOUS

## 2021-12-08 MED ORDER — GLUCAGON HCL RDNA (DIAGNOSTIC) 1 MG IJ SOLR
INTRAMUSCULAR | Status: AC
  Filled 2021-12-08: qty 2

## 2021-12-08 MED ORDER — DEXAMETHASONE SODIUM PHOSPHATE 10 MG/ML IJ SOLN
INTRAMUSCULAR | Status: DC | PRN
  Administered 2021-12-08: 4 mg via INTRAVENOUS

## 2021-12-08 MED ORDER — SUCRALFATE 1 G PO TABS
1.0000 g | ORAL_TABLET | Freq: Two times a day (BID) | ORAL | 0 refills | Status: DC
Start: 2021-12-08 — End: 2022-01-26

## 2021-12-08 MED ORDER — FENTANYL CITRATE (PF) 100 MCG/2ML IJ SOLN
INTRAMUSCULAR | Status: AC
  Filled 2021-12-08: qty 2

## 2021-12-08 MED ORDER — OMEPRAZOLE 20 MG PO CPDR: 20.0000 mg | DELAYED_RELEASE_CAPSULE | Freq: Every day | ORAL | 6 refills | Status: DC

## 2021-12-08 MED ORDER — ONDANSETRON HCL 4 MG/2ML IJ SOLN
INTRAMUSCULAR | Status: DC | PRN
  Administered 2021-12-08: 4 mg via INTRAVENOUS

## 2021-12-08 MED ORDER — MIDAZOLAM HCL 2 MG/2ML IJ SOLN
INTRAMUSCULAR | Status: AC
  Filled 2021-12-08: qty 2

## 2021-12-08 MED ORDER — ROCURONIUM BROMIDE 10 MG/ML (PF) SYRINGE
PREFILLED_SYRINGE | INTRAVENOUS | Status: DC | PRN
  Administered 2021-12-08 (×2): 10 mg via INTRAVENOUS
  Administered 2021-12-08: 70 mg via INTRAVENOUS
  Administered 2021-12-08: 10 mg via INTRAVENOUS
  Administered 2021-12-08 (×2): 20 mg via INTRAVENOUS
  Administered 2021-12-08: 10 mg via INTRAVENOUS

## 2021-12-08 MED ORDER — INSULIN ASPART 100 UNIT/ML IJ SOLN
INTRAMUSCULAR | Status: AC
  Filled 2021-12-08: qty 1

## 2021-12-08 MED ORDER — INSULIN ASPART 100 UNIT/ML IJ SOLN
INTRAMUSCULAR | Status: DC | PRN
  Administered 2021-12-08: 5 [IU] via SUBCUTANEOUS
  Administered 2021-12-08: 10 [IU] via SUBCUTANEOUS

## 2021-12-08 MED ORDER — CIPROFLOXACIN IN D5W 400 MG/200ML IV SOLN
INTRAVENOUS | Status: AC
  Filled 2021-12-08: qty 200

## 2021-12-08 MED ORDER — CLOPIDOGREL BISULFATE 75 MG PO TABS: 75.0000 mg | ORAL_TABLET | Freq: Every day | ORAL | Status: DC

## 2021-12-08 MED ORDER — OXYCODONE HCL 5 MG PO TABS: 5.0000 mg | ORAL_TABLET | Freq: Four times a day (QID) | ORAL | 0 refills | Status: DC | PRN

## 2021-12-08 MED ORDER — PROPOFOL 10 MG/ML IV BOLUS
INTRAVENOUS | Status: AC
  Filled 2021-12-08: qty 20

## 2021-12-08 MED ORDER — EPINEPHRINE 1 MG/10ML IJ SOSY
PREFILLED_SYRINGE | INTRAMUSCULAR | Status: AC
  Filled 2021-12-08: qty 10

## 2021-12-08 MED ORDER — CIPROFLOXACIN IN D5W 400 MG/200ML IV SOLN
INTRAVENOUS | Status: DC | PRN
  Administered 2021-12-08: 400 mg via INTRAVENOUS

## 2021-12-08 MED ORDER — GLUCAGON HCL RDNA (DIAGNOSTIC) 1 MG IJ SOLR
INTRAMUSCULAR | Status: AC
  Filled 2021-12-08: qty 1

## 2021-12-08 MED ORDER — MIDAZOLAM HCL 5 MG/5ML IJ SOLN
INTRAMUSCULAR | Status: DC | PRN
  Administered 2021-12-08: 2 mg via INTRAVENOUS

## 2021-12-08 MED ORDER — FENTANYL CITRATE (PF) 100 MCG/2ML IJ SOLN
INTRAMUSCULAR | Status: DC | PRN
  Administered 2021-12-08: 50 ug via INTRAVENOUS
  Administered 2021-12-08: 100 ug via INTRAVENOUS

## 2021-12-08 MED ORDER — ONDANSETRON HCL 4 MG PO TABS: 4.0000 mg | ORAL_TABLET | Freq: Three times a day (TID) | ORAL | 0 refills | Status: AC | PRN

## 2021-12-08 MED ORDER — INDOMETHACIN 50 MG RE SUPP
RECTAL | Status: AC
  Filled 2021-12-08: qty 2

## 2021-12-08 MED ORDER — PROPOFOL 10 MG/ML IV BOLUS
INTRAVENOUS | Status: DC | PRN
  Administered 2021-12-08: 160 mg via INTRAVENOUS

## 2021-12-08 MED ORDER — GLUCAGON HCL RDNA (DIAGNOSTIC) 1 MG IJ SOLR
INTRAMUSCULAR | Status: DC | PRN
  Administered 2021-12-08 (×6): .25 mg via INTRAVENOUS

## 2021-12-08 MED ORDER — EPHEDRINE SULFATE-NACL 50-0.9 MG/10ML-% IV SOSY
PREFILLED_SYRINGE | INTRAVENOUS | Status: DC | PRN
  Administered 2021-12-08: 10 mg via INTRAVENOUS

## 2021-12-08 MED ORDER — LACTATED RINGERS IV SOLN: INTRAVENOUS | Status: DC

## 2021-12-08 MED ORDER — FENTANYL CITRATE (PF) 100 MCG/2ML IJ SOLN: 25.0000 ug | Freq: Once | INTRAMUSCULAR | Status: DC

## 2021-12-08 MED ORDER — PROMETHAZINE HCL 25 MG/ML IJ SOLN: 6.2500 mg | INTRAMUSCULAR | Status: DC | PRN

## 2021-12-08 MED ORDER — PHENYLEPHRINE 40 MCG/ML (10ML) SYRINGE FOR IV PUSH (FOR BLOOD PRESSURE SUPPORT)
PREFILLED_SYRINGE | INTRAVENOUS | Status: DC | PRN
  Administered 2021-12-08: 40 ug via INTRAVENOUS
  Administered 2021-12-08 (×2): 80 ug via INTRAVENOUS
  Administered 2021-12-08: 40 ug via INTRAVENOUS
  Administered 2021-12-08 (×2): 80 ug via INTRAVENOUS
  Administered 2021-12-08: 120 ug via INTRAVENOUS
  Administered 2021-12-08 (×2): 80 ug via INTRAVENOUS
  Administered 2021-12-08: 40 ug via INTRAVENOUS
  Administered 2021-12-08: 80 ug via INTRAVENOUS

## 2021-12-08 MED ORDER — SODIUM CHLORIDE (PF) 0.9 % IJ SOLN
PREFILLED_SYRINGE | INTRAMUSCULAR | Status: DC | PRN
  Administered 2021-12-08: 1 mL

## 2021-12-08 SURGICAL SUPPLY — 15 items

## 2021-12-08 NOTE — Anesthesia Procedure Notes (Signed)
Procedure Name: Intubation Date/Time: 12/08/2021 9:30 AM Performed by: Lavina Hamman, CRNA Pre-anesthesia Checklist: Patient identified, Emergency Drugs available, Suction available, Patient being monitored and Timeout performed Patient Re-evaluated:Patient Re-evaluated prior to induction Oxygen Delivery Method: Circle system utilized Preoxygenation: Pre-oxygenation with 100% oxygen Induction Type: IV induction Ventilation: Mask ventilation without difficulty Laryngoscope Size: Mac and 3 Grade View: Grade II Tube type: Oral Tube size: 7.5 mm Number of attempts: 1 Airway Equipment and Method: Stylet Placement Confirmation: ETT inserted through vocal cords under direct vision, positive ETCO2, CO2 detector and breath sounds checked- equal and bilateral Secured at: 22 cm Tube secured with: Tape Dental Injury: Teeth and Oropharynx as per pre-operative assessment  Comments: ATOI.  Easy mask.

## 2021-12-08 NOTE — Op Note (Signed)
[facility] Patient Name: Craig Hopkins Procedure Date: 12/08/2021 MRN: [id number] Attending MD: Justice Britain , MD Date of Birth: May 05, 1961 CSN: [id number] Age: 61 Admit Type: Outpatient Procedure:                Upper EUS Indications:              Mucosal mass/polyp involving the major papilla                            (found on endoscopy) Providers:                Justice Britain, MD, Carlyn Reichert, RN, Cherylynn Ridges, Technician, Arnoldo Hooker, CRNA Referring MD:             Docia Chuck. Henrene Pastor, MD, Ishmael Holter. Forde Dandy, MD Medicines:                General Anesthesia Complications:            No immediate complications. Estimated Blood Loss:     Estimated blood loss was minimal. Procedure:                Pre-Anesthesia Assessment:                           - Prior to the procedure, a History and Physical                            was performed, and patient medications and                            allergies were reviewed. The patient's tolerance of                            previous anesthesia was also reviewed. The risks                            and benefits of the procedure and the sedation                            options and risks were discussed with the patient.                            All questions were answered, and informed consent                            was obtained. Prior Anticoagulants: The patient has                            taken Plavix (clopidogrel), last dose was 5 days                            prior to procedure. ASA Grade Assessment: III - A  patient with severe systemic disease. After                            reviewing the risks and benefits, the patient was                            deemed in satisfactory condition to undergo the                            procedure.                           After obtaining informed consent, the endoscope was                            passed  under direct vision. Throughout the                            procedure, the patient's blood pressure, pulse, and                            oxygen saturations were monitored continuously. The                            [id number]-H190 ([id number]) Olympus endoscope was introduced                            through the mouth, and advanced to the second part                            of duodenum. The [id number]-Q190V ([id number]) Olympus                            duodenoscope was introduced through the mouth, and                            advanced to the area of papilla. The GF-UE190-AL5                            ([id number]) Olympus radial ultrasound scope was                            introduced through the mouth, and advanced to the                            duodenum for ultrasound examination. The GF-[id number]                            ([id number]) Olympus linear ultrasound scope was                            introduced through the mouth, and advanced to the  duodenum for ultrasound examination from the                            stomach and duodenum. The upper EUS was                            accomplished without difficulty. The patient                            tolerated the procedure. Scope In: Scope Out: Findings:      ENDOSCOPIC FINDING: :      No gross lesions were noted in the entire esophagus.      The Z-line was irregular and was found 41 cm from the incisors.      Patchy mild inflammation characterized by erosions and granularity was       found in the entire examined stomach. Biopsies were taken with a cold       forceps for histology and Helicobacter pylori testing.      A single 20 mm mucosal nodule/polyp - adenoma was found at the major       papillary orifice.      No other gross lesions were noted in the duodenal bulb, in the first       portion of the duodenum and in the second portion of the duodenum.      ENDOSONOGRAPHIC FINDING: :      A hypoechoic  irregular lesion was identified endosonographically at the       ampulla. The lesion measured 18 mm by 15 mm in maximal cross-sectional       diameter. The lesion extended from the mucosa to the muscularis mucosa.       The endosonographic borders were well-defined. An intact interface was       seen between the mass and the pancreas and bile duct suggesting a lack       of invasion.      There was no sign of significant endosonographic abnormality in the       pancreatic head, genu of the pancreas, pancreatic body and pancreatic       tail. No masses, the pancreatic duct was regular in contour.      There was no sign of significant endosonographic abnormality in the       common bile duct. No stones, no biliary sludge and ducts with regular       contour were identified.      Endosonographic imaging in the visualized portion of the liver showed no       mass.      No malignant-appearing lymph nodes were visualized in the celiac region       (level 20), peripancreatic region and porta hepatis region.      The celiac region was visualized. Impression:               EGD Impression:                           - No gross lesions in esophagus. Z-line irregular,                            41 cm from the incisors.                           -  Gastritis. Biopsied.                           - Mucosal nodule/lesion consistent with known                            ampullary adenoma found.                           - No other gross lesions in the duodenal bulb, in                            the first portion of the duodenum and in the second                            portion of the duodenum.                           EUS Impression:                           - A lesion was found at the ampulla. A tissue                            diagnosis was obtained prior to this exam. This is                            consistent with an adenoma. No evidence of this                            extending into the  CBD or PD.                           - There was no sign of significant pathology in the                            pancreatic head, genu of the pancreas, pancreatic                            body and pancreatic tail.                           - There was no sign of significant pathology in the                            common bile duct.                           - No malignant-appearing lymph nodes were                            visualized in the celiac region (level 20),                            peripancreatic region and  porta hepatis region. Moderate Sedation:      Not Applicable - Patient had care per Anesthesia. Recommendation:           - Proceed to scheduled ERCP for attempt at                            Endoscopic Ampullectomy.                           - Observe patient's clinical course.                           - Await path results.                           - The findings and recommendations were discussed                            with the patient.                           - The findings and recommendations were discussed                            with the patient's family. Procedure Code(s):        --- Professional ---                           [phone], Esophagogastroduodenoscopy, flexible,                            transoral; with endoscopic ultrasound examination                            limited to the esophagus, stomach or duodenum, and                            adjacent structures                           43239, Esophagogastroduodenoscopy, flexible,                            transoral; with biopsy, single or multiple Diagnosis Code(s):        --- Professional ---                           K22.8, Other specified diseases of esophagus                           K29.70, Gastritis, unspecified, without bleeding                           K31.89, Other diseases of stomach and duodenum                           I89.9, Noninfective disorder of lymphatic vessels  and lymph nodes, unspecified                           K83.9, Disease of biliary tract, unspecified                           K83.8, Other specified diseases of biliary tract CPT copyright 2019 [employer]. All rights reserved. The codes documented in this report are preliminary and upon coder review may  be revised to meet current compliance requirements. Justice Britain, MD 12/08/2021 12:07:56 PM Number of Addenda: 0

## 2021-12-08 NOTE — Anesthesia Preprocedure Evaluation (Addendum)
Anesthesia Evaluation  Patient identified by MRN, date of birth, ID band Patient awake    Reviewed: Allergy & Precautions, NPO status , Patient's Chart, lab work & pertinent test results  History of Anesthesia Complications Negative for: history of anesthetic complications  Airway Mallampati: II  TM Distance: >3 FB Neck ROM: Full    Dental  (+) Dental Advisory Given   Pulmonary COPD, Current Smoker and Patient abstained from smoking.,    Pulmonary exam normal        Cardiovascular hypertension, Pt. on medications + CAD and + Cardiac Stents  Normal cardiovascular exam     Neuro/Psych PSYCHIATRIC DISORDERS Anxiety negative neurological ROS     GI/Hepatic Neg liver ROS, GERD  Medicated and Controlled,  Endo/Other  diabetes, Insulin Dependent Insulin pump Obesity   Renal/GU negative Renal ROS     Musculoskeletal  (+) Arthritis ,   Abdominal   Peds  Hematology  On plavix    Anesthesia Other Findings Hx vocal cord polyp s/p excision   Reproductive/Obstetrics                            Anesthesia Physical Anesthesia Plan  ASA: 3  Anesthesia Plan: General   Post-op Pain Management: Minimal or no pain anticipated   Induction: Intravenous  PONV Risk Score and Plan: 2 and Treatment may vary due to age or medical condition, Ondansetron, Dexamethasone and Midazolam  Airway Management Planned: Oral ETT  Additional Equipment: None  Intra-op Plan:   Post-operative Plan: Extubation in OR  Informed Consent: I have reviewed the patients History and Physical, chart, labs and discussed the procedure including the risks, benefits and alternatives for the proposed anesthesia with the patient or authorized representative who has indicated his/her understanding and acceptance.     Dental advisory given  Plan Discussed with: CRNA and Anesthesiologist  Anesthesia Plan Comments:         Anesthesia Quick Evaluation

## 2021-12-08 NOTE — Anesthesia Postprocedure Evaluation (Signed)
Anesthesia Post Note  Patient: Craig Hopkins  Procedure(s) Performed: ESOPHAGOGASTRODUODENOSCOPY (EGD) WITH PROPOFOL UPPER ENDOSCOPIC ULTRASOUND (EUS) LINEAR ENDOSCOPIC RETROGRADE CHOLANGIOPANCREATOGRAPHY (ERCP) WITH PROPOFOL BIOPSY ENDOSCOPIC MUCOSAL RESECTION SUBMUCOSAL LIFTING INJECTION SPHINCTEROTOMY SPHINCTEROTOMY HEMOSTASIS CONTROL BILIARY STENT PLACEMENT BILIARY DILATION     Patient location during evaluation: PACU Anesthesia Type: General Level of consciousness: awake and alert Pain management: pain level controlled Vital Signs Assessment: post-procedure vital signs reviewed and stable Respiratory status: spontaneous breathing, nonlabored ventilation and respiratory function stable Cardiovascular status: stable and blood pressure returned to baseline Anesthetic complications: no   No notable events documented.  Last Vitals:  Vitals:   12/08/21 1408 12/08/21 1418  BP: (!) 148/84 (!) 148/82  Pulse: 72 72  Resp: 19 (!) 22  Temp:    SpO2: 94% 96%    Last Pain:  Vitals:   12/08/21 1418  TempSrc:   PainSc: 0-No pain                 Audry Pili

## 2021-12-08 NOTE — Op Note (Addendum)
[facility] Patient Name: Craig Hopkins Procedure Date: 12/08/2021 MRN: [id number] Attending MD: Justice Britain , MD Date of Birth: 1961-03-22 CSN: [id number] Age: 61 Admit Type: Outpatient Procedure:                ERCP Indications:              Periampullary tumor, Ampullary/Periampullary                            Papillary Adenoma, Planned major papilla endoscopic                            sphincterotomy Providers:                Justice Britain, MD, Carlyn Reichert, RN, Cherylynn Ridges, Technician, Arnoldo Hooker, CRNA Referring MD:             Ishmael Holter. Forde Dandy, MD, Docia Chuck. Henrene Pastor, MD Medicines:                General Anesthesia, Cipro 400 mg IV, Indomethacin                            100 mg PR after patient was asleep, Glucagon 1.75                            mg IV Complications:            No immediate complications. Estimated blood loss:                            Minimal. Estimated Blood Loss:     Estimated blood loss was minimal. Procedure:                Pre-Anesthesia Assessment:                           - Prior to the procedure, a History and Physical                            was performed, and patient medications and                            allergies were reviewed. The patient's tolerance of                            previous anesthesia was also reviewed. The risks                            and benefits of the procedure and the sedation                            options and risks were discussed with the patient.                            All  questions were answered, and informed consent                            was obtained. Prior Anticoagulants: The patient has                            taken Plavix (clopidogrel), last dose was 5 days                            prior to procedure. ASA Grade Assessment: III - A                            patient with severe systemic disease. After                            reviewing  the risks and benefits, the patient was                            deemed in satisfactory condition to undergo the                            procedure.                           After obtaining informed consent, the scope was                            passed under direct vision. Throughout the                            procedure, the patient's blood pressure, pulse, and                            oxygen saturations were monitored continuously. The                            [id number]-Q190V ([id number] Olympus duodenoscope was                            introduced through the mouth, and used to inject                            contrast into and used to inject contrast into the                            bile duct and ventral pancreatic duct. The ERCP was                            technically difficult and complex due to.                            Successful completion of the procedure was aided by  performing the maneuvers documented (below) in this                            report. The patient tolerated the procedure. Scope In: Scope Out: Findings:      The scout film was normal.      The esophagus was successfully intubated under direct vision without       detailed examination of the pharynx, larynx, and associated structures,       and upper GI tract as it had just been done during EUS. The ampulla had       evidence of adenomatous change (from previous sampling this was       confirmed).      Since EUS showed no evidence of extension of the adenoma into the       biliary or pancreatic ducts, decision made to pursue attempt at       ampullectomy.      A short 0.035 inch Soft Jagwire was passed into the biliary tree. The       Hydratome sphincterotome was passed over the guidewire and the bile duct       was then deeply cannulated. Contrast was injected. I personally       interpreted the bile duct images. Ductal flow of contrast was adequate.       Image  quality was adequate. Contrast extended to the hepatic ducts.       Opacification of the entire biliary tree except for the gallbladder was       successful. The maximum diameter of the ducts was 3 mm. This wire was       left in place.      A short 0.035 inch Soft Jagwire was then able to be passed into the       ventral pancreatic duct. The ventral pancreatic duct was then deeply       cannulated with the Hydratome sphincterotome. Contrast was injected. I       personally interpreted the pancreatic duct images. Ductal flow of       contrast was adequate. Image quality was adequate. Contrast extended to       the pancreatic duct. Opacification of the pancreatic duct at the head -       body junction of the pancreas was successful. The maximum diameter of       the ducts was 1-2 mm. The wire was left in place.      A 3 mm biliary sphincterotomy was made with a monofilament Hydratome       sphincterotome using ERBE electrocautery. There was no       post-sphincterotomy bleeding.      A 3 mm ventral pancreatic sphincterotomy was made with a monofilament       Hydratome sphincterotome using ERBE electrocautery. There was no       post-sphincterotomy bleeding.      Area was successfully injected with 5 mL Olympus Endolift agent through       the ERCP scope for lesion assessment, and this injection appeared to       lift the lesion adequately. Using a hexagonal snare, the major papilla       was grasped in the semi-long position. The papilla was then resected       using ERBE electrocautery. Piecemeal mucosal resection was required with       2 pieces allowing for complete removal and retrieval  were complete.       There was a very mild ooze that occured a few minutes after the       resection occured. This area was successfully injected with 1 mL of a       1:100,000 solution of epinephrine through the ERCP scope for hemostasis       with good effect.      I then proceeded with placement of a  short 0.035 inch Soft Jagwire was       passed into the biliary tree. Dilation of the distal CBD with a 4 mm       Hurricane balloon dilator for additional tamponade effect was performed       (though oozing had already subsided prior to this). The wire was removed.      Multiple attempts at trying to get into the ventral pancreatic duct were       not successful, even after our original pancreatic sphincterotomy.      The biliary drainage was slow, to ensure adequate drainage, a short       0.035 inch Soft Jagwire was passed into the biliary tree. The Hydratome       sphincterotome was passed over the guidewire and the bile duct was then       deeply cannulated. Contrast was injected. One 8.5 Fr by 5 cm plastic       biliary stent with a single external flap and a single internal flap was       placed into the common bile duct. Bile flowed through the stent. The       stent was in good position.      After significant attempts unsuccesfully at placement of a vental PD       prophylactic wire/stent, decision was made to stop the procedure.      The duodenoscope was withdrawn from the patient. Impression:               - Ampullary adenoma identified and here for attempt                            at endoscopic resection.                           - A small biliary sphincterotomy was performed.                           - A small pancreatic sphincterotomy was performed.                           - Snare mucosal papillectomy via lifting agent of                            the major papilla was performed. Resection and                            retrieval were complete. Piecemeal resection                            required.                           -  Mild ooze noted a few minutes after resection had                            been completed. This was injected with 1 mL of                            1:100000 epinephrine with good effect. Additional                            tamponade effect  with a balloon at the distal CBD                            was performed.                           - One plastic biliary stent was placed into the                            common bile duct due to sluggish flow and to                            decrease risk of cholangitis.                           - After significant attempts at trying to re-enter                            the pancreas duct we could not get into this area.                            Thus prophylactic stenting was not possible today. Moderate Sedation:      Not Applicable - Patient had care per Anesthesia. Recommendation:           - The patient will be observed post-procedure,                            until all discharge criteria are met.                           - If he is doing well then discharge patient to                            home.                           - Patient has a contact number available for                            emergencies. The signs and symptoms of potential                            delayed complications were discussed with the  patient. Return to normal activities tomorrow.                            Written discharge instructions were provided to the                            patient.                           - Observe patient's clinical course.                           - Await path results.                           - Repeat ERCP in 3-4 months to remove stent.                           - Increase to Omeprazole 20 mg twice daily for 1                            month.                           - Start Carafate twice daily (liquid over tablet if                            possible otherwise can do tablet and learn how to                            do slurry mixture).                           - Restart Plavix on 1/19 AM/PM depending on when he                            normally takes this in effort of trying to decrease                             post-interventional bleeding.                           - Antiemetics Q8H PRN Zofran 4 mg sent to pharmacy.                           - Oxycodone 5 mg Q6H PRN (total 3 days) to get                            patient through post-procedure period.                           - Watch for pancreatitis, bleeding, perforation,                            and cholangitis.                           -  The findings and recommendations were discussed                            with the patient.                           - The findings and recommendations were discussed                            with the patient's family. Procedure Code(s):        --- Professional ---                           [phone], Endoscopic retrograde                            cholangiopancreatography (ERCP); with placement of                            endoscopic stent into biliary or pancreatic duct,                            including pre- and post-dilation and guide wire                            passage, when performed, including sphincterotomy,                            when performed, each stent                           06237, Esophagogastroduodenoscopy, flexible,                            transoral; with endoscopic mucosal resection                           [phone], Unlisted procedure, biliary tract Diagnosis Code(s):        --- Professional ---                           D49.0, Neoplasm of unspecified behavior of                            digestive system                           D13.5, Benign neoplasm of extrahepatic bile ducts CPT copyright 2019 [employer]. All rights reserved. The codes documented in this report are preliminary and upon coder review may  be revised to meet current compliance requirements. Justice Britain, MD 12/08/2021 12:26:38 PM Number of Addenda: 0

## 2021-12-08 NOTE — H&P (Signed)
GASTROENTEROLOGY PROCEDURE H&P NOTE   Primary Care Physician: Reynold Bowen, MD  HPI: Craig Hopkins is a 61 y.o. 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  Past Medical History:  Diagnosis Date   CAD (coronary artery disease)    COPD (chronic obstructive pulmonary disease) (HCC)    GERD (gastroesophageal reflux disease)    HTN (hypertension)    Hypercholesteremia    Hyperlipidemia    IDDM (insulin dependent diabetes mellitus)    Neuromuscular disorder (HCC)    Panic attack    Past Surgical History:  Procedure Laterality Date   CAROTID STENT  11/23/2006   COLONOSCOPY     heart catherization  11/23/2009   left wrist  11/23/1974   throat polyp removed  11/23/1994   TONSILLECTOMY  11/23/1998   UPPER GASTROINTESTINAL ENDOSCOPY     Current Facility-Administered Medications  Medication Dose Route Frequency Provider Last Rate Last Admin   0.9 %  sodium chloride infusion   Intravenous Continuous Mansouraty, Telford Nab., MD       fentaNYL (SUBLIMAZE) injection 25-50 mcg  25-50 mcg Intravenous Q5 min PRN Audry Pili, MD       lactated ringers infusion   Intravenous Continuous Mansouraty, Telford Nab., MD       promethazine (PHENERGAN) injection 6.25-12.5 mg  6.25-12.5 mg Intravenous Q15 min PRN Audry Pili, MD        Current Facility-Administered Medications:    0.9 %  sodium chloride infusion, , Intravenous, Continuous, Mansouraty, Telford Nab., MD   fentaNYL (SUBLIMAZE) injection 25-50 mcg, 25-50 mcg, Intravenous, Q5 min PRN, Audry Pili, MD   lactated ringers infusion, , Intravenous, Continuous, Mansouraty, Telford Nab., MD   promethazine (PHENERGAN) injection 6.25-12.5 mg, 6.25-12.5 mg, Intravenous, Q15 min PRN, Audry Pili, MD No Known Allergies Family History  Problem Relation Age of Onset   Breast cancer Mother 15   Coronary artery disease Father    Stroke Father    Cancer Other        family history    Coronary  artery disease Other        family history    Esophageal cancer Neg Hx    Colon cancer Neg Hx    Rectal cancer Neg Hx    Stomach cancer Neg Hx    Inflammatory bowel disease Neg Hx    Liver disease Neg Hx    Pancreatic cancer Neg Hx    Social History   Socioeconomic History   Marital status: Divorced    Spouse name: Not on file   Number of children: Not on file   Years of education: [employer]   Highest education level: Not on file  Occupational History   Not on file  Tobacco Use   Smoking status: Former   Smokeless tobacco: Former  Scientific laboratory technician Use: Every day  Substance and Sexual Activity   Alcohol use: Yes    Alcohol/week: 1.0 standard drink    Types: 1 Standard drinks or equivalent per week   Drug use: Yes    Types: Marijuana    Comment: last use 1 1/2 weeks ago early sep 2022   Sexual activity: Not on file  Other Topics Concern   Not on file  Social History Narrative   Not on file   Social Determinants of Health   Financial Resource Strain: Not on file  Food Insecurity: Not on file  Transportation Needs: Not on file  Physical Activity: Not  on file  Stress: Not on file  Social Connections: Not on file  Intimate Partner Violence: Not on file    Physical Exam: There were no vitals filed for this visit. There is no height or weight on file to calculate BMI. GEN: NAD EYE: Sclerae anicteric ENT: MMM CV: Non-tachycardic GI: Soft, NT/ND NEURO:  Alert & Oriented x 3  Lab Results: No results for input(s): WBC, HGB, HCT, PLT in the last 72 hours. BMET No results for input(s): NA, K, CL, CO2, GLUCOSE, BUN, CREATININE, CALCIUM in the last 72 hours. LFT No results for input(s): PROT, ALBUMIN, AST, ALT, ALKPHOS, BILITOT, BILIDIR, IBILI in the last 72 hours. PT/INR No results for input(s): LABPROT, INR in the last 72 hours.   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  Last day of  Plavix on 1/10.  The risks of an EUS including intestinal perforation, bleeding, infection, aspiration, and medication effects were discussed as was the possibility it may not give a definitive diagnosis if a biopsy is performed.  When a biopsy of the pancreas is done as part of the EUS, there is an additional risk of pancreatitis at the rate of about 1-2%.  It was explained that procedure related pancreatitis is typically mild, although it can be severe and even life threatening, which is why we do not perform random pancreatic biopsies and only biopsy a lesion/area we feel is concerning enough to warrant the risk.   The risks of an ERCP were discussed at length, including but not limited to the risk of perforation, bleeding, abdominal pain, post-ERCP pancreatitis (while usually mild can be severe and even life threatening).   The risks and benefits of endoscopic evaluation/treatment were discussed with the patient and/or family; these include but are not limited to the risk of perforation, infection, bleeding, missed lesions, lack of diagnosis, severe illness requiring hospitalization, as well as anesthesia and sedation related illnesses.  The patient's history has been reviewed, patient examined, no change in status, and deemed stable for procedure.  The patient and/or family is agreeable to proceed.    Justice Britain, MD [facility] Office # [phone]

## 2021-12-08 NOTE — Progress Notes (Signed)
I evaluated the patient after his procedure and after discussing the results with his family. His discomfort was rated 4-5 out of 10 and more of a stomachache rather than severe sharp stabbing pain. He was given 25 mcg of fentanyl. He was reassessed 45 minutes later his discomfort is rated as a 2-3 out of 10 and an improvement and still more of an ache rather than a sharp or stabbing pain. He is tolerating clear liquids. His blood sugars are beginning to decrease. He is to be administered another 25 mcg of fentanyl. We will proceed with a KUB 2 view (upright and decubitus).  We will also proceed with a chest x-ray to evaluate for any free air. Likelihood is a very low for there to be any issues. The patient continues to do well we will consider discharge with a short course of oxycodone as well as antiemetic in an effort of trying to keep the patient out of the hospital if possible. He is in agreement with this. He does understand that if things worsen or progress or shoe shows any signs/symptoms of active bleeding, he will need to return to the hospital. We will reassess after his x-rays are completed.   Justice Britain, MD [facility] Office # [phone]

## 2021-12-08 NOTE — Discharge Instructions (Addendum)
YOU HAD AN ENDOSCOPIC PROCEDURE TODAY: Refer to the procedure report and other information in the discharge instructions given to you for any specific questions about what was found during the examination. If this information does not answer your questions, please call [place] office at [phone] to clarify.   YOU SHOULD EXPECT: Some feelings of bloating in the abdomen. Passage of more gas than usual. Walking can help get rid of the air that was put into your GI tract during the procedure and reduce the bloating. If you had a lower endoscopy (such as a colonoscopy or flexible sigmoidoscopy) you may notice spotting of blood in your stool or on the toilet paper. Some abdominal soreness may be present for a day or two, also.  DIET: Your first meal following the procedure should be a light meal and then it is ok to progress to your normal diet. A half-sandwich or bowl of soup is an example of a good first meal. Heavy or fried foods are harder to digest and may make you feel nauseous or bloated. Drink plenty of fluids but you should avoid alcoholic beverages for 24 hours. If you had a esophageal dilation, please see attached instructions for diet.    ACTIVITY: Your care partner should take you home directly after the procedure. You should plan to take it easy, moving slowly for the rest of the day. You can resume normal activity the day after the procedure however YOU SHOULD NOT DRIVE, use power tools, machinery or perform tasks that involve climbing or major physical exertion for 24 hours (because of the sedation medicines used during the test).   SYMPTOMS TO REPORT IMMEDIATELY: A gastroenterologist can be reached at any hour. Please call [phone]  for any of the following symptoms:   Following upper endoscopy (EGD, EUS, ERCP, esophageal dilation) Vomiting of blood or coffee ground material  New, significant abdominal pain  New, significant chest pain or pain under the shoulder blades  Painful or  persistently difficult swallowing  New shortness of breath  Black, tarry-looking or red, bloody stools  FOLLOW UP:  If any biopsies were taken you will be contacted by phone or by letter within the next 1-3 weeks. Call [phone]  if you have not heard about the biopsies in 3 weeks.  Please also call with any specific questions about appointments or follow up tests.  

## 2021-12-08 NOTE — Transfer of Care (Signed)
Immediate Anesthesia Transfer of Care Note  Patient: Craig Hopkins  Procedure(s) Performed: Procedure(s) with comments: ESOPHAGOGASTRODUODENOSCOPY (EGD) WITH PROPOFOL (N/A) UPPER ENDOSCOPIC ULTRASOUND (EUS) LINEAR (N/A) ENDOSCOPIC RETROGRADE CHOLANGIOPANCREATOGRAPHY (ERCP) WITH PROPOFOL (N/A) BIOPSY ENDOSCOPIC MUCOSAL RESECTION - Ampulectomy SUBMUCOSAL LIFTING INJECTION SPHINCTEROTOMY - Biliary SPHINCTEROTOMY - Pancreatic HEMOSTASIS CONTROL BILIARY STENT PLACEMENT (N/A) BALLOON DILATION (N/A) - Biliary  Patient Location: PACU  Anesthesia Type:General  Level of Consciousness:  sedated, patient cooperative and responds to stimulation  Airway & Oxygen Therapy:Patient Spontanous Breathing and Patient connected to face mask oxgen  Post-op Assessment:  Report given to PACU RN and Post -op Vital signs reviewed and stable  Post vital signs:  Reviewed and stable  Last Vitals:  Vitals:   12/08/21 0834 12/08/21 1208  BP: 122/79 (!) 128/44  Pulse: 77 87  Resp: 20 12  Temp: 36.6 C (!) 36.4 C  SpO2: 67%     Complications: No apparent anesthesia complications

## 2021-12-09 ENCOUNTER — Encounter (HOSPITAL_COMMUNITY): Payer: Self-pay | Admitting: Gastroenterology

## 2021-12-09 MED ORDER — OXYCODONE HCL 10 MG PO TABS: 5.0000 mg | ORAL_TABLET | Freq: Four times a day (QID) | ORAL | 0 refills | Status: AC | PRN

## 2021-12-09 NOTE — Telephone Encounter (Signed)
Thanks for taking care of this. GM

## 2021-12-09 NOTE — Telephone Encounter (Signed)
Updated Rx sent to pharmacy. Thanks. GM

## 2021-12-09 NOTE — Telephone Encounter (Signed)
Inbound call from patient pharmacy. States the oxycodone 5mg  tablets are on back order and have been for a while. Would like to know if it is okay for patient to receive oxycodone capsule 5mg  or oxycodone tablet 10mg  and break in half. Best contact number [phone]

## 2021-12-09 NOTE — Telephone Encounter (Signed)
Sent the updated prescription. Thanks. GM

## 2021-12-11 LAB — SURGICAL PATHOLOGY

## 2021-12-12 ENCOUNTER — Encounter: Payer: Self-pay | Admitting: Gastroenterology

## 2021-12-14 ENCOUNTER — Emergency Department (HOSPITAL_COMMUNITY)
Admission: EM | Admit: 2021-12-14 | Discharge: 2021-12-14 | Disposition: A | Discharge status: Home / Self Care | Payer: BC Managed Care – PPO | Source: Home / Self Care | Attending: Emergency Medicine | Admitting: Emergency Medicine

## 2021-12-14 ENCOUNTER — Emergency Department (HOSPITAL_COMMUNITY): Payer: BC Managed Care – PPO

## 2021-12-14 ENCOUNTER — Encounter (HOSPITAL_COMMUNITY): Payer: Self-pay

## 2021-12-14 DIAGNOSIS — K219 Gastro-esophageal reflux disease without esophagitis: Secondary | ICD-10-CM | POA: Insufficient documentation

## 2021-12-14 DIAGNOSIS — Z9889 Other specified postprocedural states: Secondary | ICD-10-CM | POA: Diagnosis not present

## 2021-12-14 DIAGNOSIS — I700 Atherosclerosis of aorta: Secondary | ICD-10-CM | POA: Diagnosis not present

## 2021-12-14 DIAGNOSIS — Z7902 Long term (current) use of antithrombotics/antiplatelets: Secondary | ICD-10-CM | POA: Diagnosis not present

## 2021-12-14 DIAGNOSIS — E119 Type 2 diabetes mellitus without complications: Secondary | ICD-10-CM | POA: Insufficient documentation

## 2021-12-14 DIAGNOSIS — Z4659 Encounter for fitting and adjustment of other gastrointestinal appliance and device: Secondary | ICD-10-CM | POA: Diagnosis not present

## 2021-12-14 DIAGNOSIS — Z955 Presence of coronary angioplasty implant and graft: Secondary | ICD-10-CM | POA: Diagnosis not present

## 2021-12-14 DIAGNOSIS — Z79899 Other long term (current) drug therapy: Secondary | ICD-10-CM | POA: Insufficient documentation

## 2021-12-14 DIAGNOSIS — I10 Essential (primary) hypertension: Secondary | ICD-10-CM | POA: Diagnosis not present

## 2021-12-14 DIAGNOSIS — R109 Unspecified abdominal pain: Secondary | ICD-10-CM | POA: Diagnosis not present

## 2021-12-14 DIAGNOSIS — Z0389 Encounter for observation for other suspected diseases and conditions ruled out: Secondary | ICD-10-CM | POA: Diagnosis not present

## 2021-12-14 DIAGNOSIS — R7989 Other specified abnormal findings of blood chemistry: Secondary | ICD-10-CM | POA: Diagnosis not present

## 2021-12-14 DIAGNOSIS — K828 Other specified diseases of gallbladder: Secondary | ICD-10-CM | POA: Diagnosis not present

## 2021-12-14 DIAGNOSIS — Z9641 Presence of insulin pump (external) (internal): Secondary | ICD-10-CM | POA: Diagnosis present

## 2021-12-14 DIAGNOSIS — R1013 Epigastric pain: Secondary | ICD-10-CM | POA: Diagnosis not present

## 2021-12-14 DIAGNOSIS — E10319 Type 1 diabetes mellitus with unspecified diabetic retinopathy without macular edema: Secondary | ICD-10-CM | POA: Diagnosis not present

## 2021-12-14 DIAGNOSIS — E7800 Pure hypercholesterolemia, unspecified: Secondary | ICD-10-CM | POA: Diagnosis not present

## 2021-12-14 DIAGNOSIS — D72829 Elevated white blood cell count, unspecified: Secondary | ICD-10-CM | POA: Insufficient documentation

## 2021-12-14 DIAGNOSIS — E1043 Type 1 diabetes mellitus with diabetic autonomic (poly)neuropathy: Secondary | ICD-10-CM | POA: Diagnosis not present

## 2021-12-14 DIAGNOSIS — Z794 Long term (current) use of insulin: Secondary | ICD-10-CM | POA: Insufficient documentation

## 2021-12-14 DIAGNOSIS — R7401 Elevation of levels of liver transaminase levels: Secondary | ICD-10-CM | POA: Insufficient documentation

## 2021-12-14 DIAGNOSIS — F410 Panic disorder [episodic paroxysmal anxiety] without agoraphobia: Secondary | ICD-10-CM | POA: Diagnosis not present

## 2021-12-14 DIAGNOSIS — R1010 Upper abdominal pain, unspecified: Secondary | ICD-10-CM | POA: Diagnosis not present

## 2021-12-14 DIAGNOSIS — Z7982 Long term (current) use of aspirin: Secondary | ICD-10-CM | POA: Insufficient documentation

## 2021-12-14 DIAGNOSIS — E871 Hypo-osmolality and hyponatremia: Secondary | ICD-10-CM | POA: Diagnosis not present

## 2021-12-14 DIAGNOSIS — D135 Benign neoplasm of extrahepatic bile ducts: Secondary | ICD-10-CM | POA: Diagnosis not present

## 2021-12-14 DIAGNOSIS — K3184 Gastroparesis: Secondary | ICD-10-CM | POA: Diagnosis not present

## 2021-12-14 DIAGNOSIS — E669 Obesity, unspecified: Secondary | ICD-10-CM | POA: Diagnosis not present

## 2021-12-14 DIAGNOSIS — R110 Nausea: Secondary | ICD-10-CM | POA: Insufficient documentation

## 2021-12-14 DIAGNOSIS — D72828 Other elevated white blood cell count: Secondary | ICD-10-CM | POA: Diagnosis not present

## 2021-12-14 DIAGNOSIS — I2510 Atherosclerotic heart disease of native coronary artery without angina pectoris: Secondary | ICD-10-CM | POA: Diagnosis not present

## 2021-12-14 DIAGNOSIS — E876 Hypokalemia: Secondary | ICD-10-CM | POA: Diagnosis not present

## 2021-12-14 DIAGNOSIS — J449 Chronic obstructive pulmonary disease, unspecified: Secondary | ICD-10-CM | POA: Diagnosis not present

## 2021-12-14 DIAGNOSIS — Z20822 Contact with and (suspected) exposure to covid-19: Secondary | ICD-10-CM | POA: Diagnosis not present

## 2021-12-14 DIAGNOSIS — E1065 Type 1 diabetes mellitus with hyperglycemia: Secondary | ICD-10-CM | POA: Diagnosis not present

## 2021-12-14 DIAGNOSIS — I81 Portal vein thrombosis: Secondary | ICD-10-CM | POA: Diagnosis not present

## 2021-12-14 DIAGNOSIS — B179 Acute viral hepatitis, unspecified: Secondary | ICD-10-CM | POA: Diagnosis not present

## 2021-12-14 LAB — COMPREHENSIVE METABOLIC PANEL
ALT: 75 U/L — ABNORMAL HIGH (ref 0–44)
AST: 149 U/L — ABNORMAL HIGH (ref 15–41)
Albumin: 3.8 g/dL (ref 3.5–5.0)
Alkaline Phosphatase: 106 U/L (ref 38–126)
Anion gap: 7 (ref 5–15)
BUN: 27 mg/dL — ABNORMAL HIGH (ref 6–20)
CO2: 26 mmol/L (ref 22–32)
Calcium: 8.7 mg/dL — ABNORMAL LOW (ref 8.9–10.3)
Chloride: 101 mmol/L (ref 98–111)
Creatinine, Ser: 0.82 mg/dL (ref 0.61–1.24)
GFR, Estimated: >60 mL/min (ref >60)
Glucose, Bld: 228 mg/dL — ABNORMAL HIGH (ref 70–99)
Potassium: 4 mmol/L (ref 3.5–5.1)
Sodium: 134 mmol/L — ABNORMAL LOW (ref 135–145)
Total Bilirubin: 1.7 mg/dL — ABNORMAL HIGH (ref 0.3–1.2)
Total Protein: 6.8 g/dL (ref 6.5–8.1)

## 2021-12-14 LAB — CBC WITH DIFFERENTIAL/PLATELET
Abs Immature Granulocytes: 0.04 10*3/uL (ref 0.00–0.07)
Basophils Absolute: 0.1 10*3/uL (ref 0.0–0.1)
Basophils Relative: 1 %
Eosinophils Absolute: 0.2 10*3/uL (ref 0.0–0.5)
Eosinophils Relative: 2 %
HCT: 45.1 % (ref 39.0–52.0)
Hemoglobin: 15.7 g/dL (ref 13.0–17.0)
Immature Granulocytes: 0 %
Lymphocytes Relative: 10 %
Lymphs Abs: 1.2 10*3/uL (ref 0.7–4.0)
MCH: 31.2 pg (ref 26.0–34.0)
MCHC: 34.8 g/dL (ref 30.0–36.0)
MCV: 89.7 fL (ref 80.0–100.0)
Monocytes Absolute: 1 10*3/uL (ref 0.1–1.0)
Monocytes Relative: 8 %
Neutro Abs: 9.8 10*3/uL — ABNORMAL HIGH (ref 1.7–7.7)
Neutrophils Relative %: 79 %
Platelets: 461 10*3/uL — ABNORMAL HIGH (ref 150–400)
RBC: 5.03 MIL/uL (ref 4.22–5.81)
RDW: 12 % (ref 11.5–15.5)
WBC: 12.4 10*3/uL — ABNORMAL HIGH (ref 4.0–10.5)
nRBC: 0 % (ref 0.0–0.2)

## 2021-12-14 LAB — LIPASE, BLOOD: Lipase: 427 U/L — ABNORMAL HIGH (ref 11–51)

## 2021-12-14 MED ORDER — SODIUM CHLORIDE 0.9 % IV BOLUS
1000.0000 mL | Freq: Once | INTRAVENOUS | Status: AC
  Administered 2021-12-14: 1000 mL via INTRAVENOUS

## 2021-12-14 MED ORDER — IOHEXOL 300 MG/ML  SOLN
100.0000 mL | Freq: Once | INTRAMUSCULAR | Status: AC | PRN
  Administered 2021-12-14: 100 mL via INTRAVENOUS

## 2021-12-14 MED ORDER — OXYCODONE-ACETAMINOPHEN 5-325 MG PO TABS
1.0000 | ORAL_TABLET | Freq: Four times a day (QID) | ORAL | 0 refills | Status: DC | PRN
Start: 2021-12-14 — End: 2022-01-26

## 2021-12-14 NOTE — ED Triage Notes (Signed)
Pt arrived via POV, c/o diffuse abd pain. Recent EGD. Denies any diarrhea, vomiting, fevers.

## 2021-12-14 NOTE — ED Notes (Signed)
ED Provider at bedside. 

## 2021-12-14 NOTE — ED Provider Notes (Signed)
[facility] DEPT Provider Note   CSN: [id number] Arrival date & time: 12/14/21  1302     History  Chief Complaint  Patient presents with   Abdominal Pain    Craig Hopkins is a 61 y.o. male past medical history of GERD, hypertension, COPD, CAD status post stents, diabetes mellitus, ampullary adenoma status postresection.  Per chart review patient had EGD and ERCP performed on 1/16 by gastroenterologist Dr. Rush Landmark.  Surgical pathology showed biopsy of stomach with chronic gastroenteritis, scattered organisms suggestive of H. pylori.  Ampulla surgical pathology shows adenoma no high-grade dysplasia or malignancy.  Presents to the emergency department today with a chief complaint of upper abdominal pain.  Patient states that pain started 11:00 this morning.  States that he woke up took his Carafate medication and went back to bed.  Upon waking he had severe pain to the upper abdomen.  Patient states the pain became progressively worse.  Patient describes pain as a "intense burning."  Patient rated pain 10/10 on the pain scale.  Patient took the prescribed oxycodone with improvement in his pain.  Patient rates pain 1/10 on pain scale.  Patient endorses nausea with his pain.  Patient reports that he had a "heavy meal last night," stating that he ate pasta alfredo and bread.  Patient denies any alcohol use since December.  Endorses marijuana use.  Patient denies any fever, chills, abdominal symptoms, constipation, diarrhea, blood in stool, melena, vomiting, dysuria, hematuria, urinary urgency, pain or swelling to genitals.  Abdominal Pain Associated symptoms: nausea   Associated symptoms: no chest pain, no chills, no constipation, no diarrhea, no dysuria, no fever, no hematuria, no shortness of breath and no vomiting       Home Medications Prior to Admission medications   Medication Sig Start Date End Date Taking? Authorizing Provider  ALPRAZolam Duanne Moron)  0.25 MG tablet Take 0.25 mg by mouth at bedtime as needed for anxiety or sleep.  12/24/11   [provider]  amLODipine-benazepril (LOTREL) 5-10 MG capsule Take 1 capsule by mouth daily. 12/17/20   [provider]  aspirin EC 81 MG tablet Take 1 tablet (81 mg total) by mouth daily. 01/09/16   Nahser, Wonda Cheng, MD  atorvastatin (LIPITOR) 40 MG tablet Take 40 mg by mouth daily. 12/08/19   [provider]  Carboxymethylcellul-Glycerin (CLEAR EYES FOR DRY EYES) 1-0.25 % SOLN Place 1 drop into both eyes daily as needed (dry eyes).    [provider]  clopidogrel (PLAVIX) 75 MG tablet Take 1 tablet (75 mg total) by mouth daily. 12/11/21   Mansouraty, Telford Nab., MD  Continuous Blood Gluc Sensor (FREESTYLE LIBRE 2 SENSOR) MISC USE TO CHECK GLUCOSE DAILY - REPLACE SENSOR EVERY 14 DAYS. 08/02/21   [provider]  hydrochlorothiazide (HYDRODIURIL) 25 MG tablet Take 25 mg by mouth daily.    [provider]  insulin aspart (NOVOLOG) 100 UNIT/ML injection Inject into the skin See admin instructions. Via Insulin Pump    [provider]  Insulin Human (INSULIN PUMP) SOLN Inject into the skin.    [provider]  Melatonin 5 MG CHEW Chew 5 mg by mouth at bedtime as needed (sleep).    [provider]  omeprazole (PRILOSEC) 20 MG capsule Take 1 capsule (20 mg total) by mouth daily. 12/08/21   Mansouraty, Telford Nab., MD  sucralfate (CARAFATE) 1 g tablet Take 1 tablet (1 g total) by mouth 2 (two) times daily. 12/08/21   Mansouraty, Valarie Merino  Brooke Bonito., MD  tadalafil (CIALIS) 5 MG tablet Take 5 mg by mouth daily as needed for erectile dysfunction. 02/10/20   [provider]      Allergies    Patient has no known allergies.    Review of Systems   Review of Systems  Constitutional:  Negative for chills and fever.  Eyes:  Negative for visual disturbance.  Respiratory:  Negative for shortness of breath.   Cardiovascular:  Negative for chest  pain.  Gastrointestinal:  Positive for abdominal pain and nausea. Negative for abdominal distention, anal bleeding, blood in stool, constipation, diarrhea, rectal pain and vomiting.  Genitourinary:  Negative for difficulty urinating, dysuria, flank pain, hematuria, penile discharge, penile pain, penile swelling, scrotal swelling and testicular pain.  Musculoskeletal:  Negative for back pain and neck pain.  Skin:  Negative for color change and rash.  Neurological:  Negative for dizziness, syncope, light-headedness and headaches.  Psychiatric/Behavioral:  Negative for confusion.    Physical Exam Updated Vital Signs BP (!) 152/98 (BP Location: Left Arm)    Pulse 95    Temp 97.9 F (36.6 C) (Oral)    Resp 18    SpO2 100%  Physical Exam Vitals and nursing note reviewed.  Constitutional:      General: He is not in acute distress.    Appearance: He is not ill-appearing, toxic-appearing or diaphoretic.  HENT:     Head: Normocephalic.  Eyes:     General: No scleral icterus.       Right eye: No discharge.        Left eye: No discharge.  Cardiovascular:     Rate and Rhythm: Normal rate.  Pulmonary:     Effort: Pulmonary effort is normal.  Abdominal:     General: Abdomen is protuberant. Bowel sounds are normal. There is no distension. There are no signs of injury.     Palpations: Abdomen is soft. There is no mass or pulsatile mass.     Tenderness: There is abdominal tenderness in the right upper quadrant and epigastric area. There is no right CVA tenderness, left CVA tenderness, guarding or rebound.     Hernia: There is no hernia in the umbilical area or ventral area.  Skin:    General: Skin is warm and dry.  Neurological:     General: No focal deficit present.     Mental Status: He is alert.  Psychiatric:        Behavior: Behavior is cooperative.    ED Results / Procedures / Treatments   Labs (all labs ordered are listed, but only abnormal results are displayed) Labs Reviewed  CBC  WITH DIFFERENTIAL/PLATELET - Abnormal; Notable for the following components:      Result Value   WBC 12.4 (*)    Platelets 461 (*)    Neutro Abs 9.8 (*)    All other components within normal limits  COMPREHENSIVE METABOLIC PANEL - Abnormal; Notable for the following components:   Sodium 134 (*)    Glucose, Bld 228 (*)    BUN 27 (*)    Calcium 8.7 (*)    AST 149 (*)    ALT 75 (*)    Total Bilirubin 1.7 (*)    All other components within normal limits  LIPASE, BLOOD - Abnormal; Notable for the following components:   Lipase 427 (*)    All other components within normal limits  URINALYSIS, ROUTINE W REFLEX MICROSCOPIC    EKG None  Radiology No results found.  Procedures  Procedures    Medications Ordered in ED Medications  sodium chloride 0.9 % bolus 1,000 mL (1,000 mLs Intravenous New Bag/Given 12/14/21 1443)  iohexol (OMNIPAQUE) 300 MG/ML solution 100 mL (100 mLs Intravenous Contrast Given 12/14/21 1525)    ED Course/ Medical Decision Making/ A&P                           Medical Decision Making Amount and/or Complexity of Data Reviewed Radiology: ordered.  Risk Prescription drug management.   This patient presents to the ED for concern of abdominal pain, this involves an extensive number of treatment options, and is a complaint that carries with it a high risk of complications and morbidity.  The differential diagnosis includes but is not limited to pancreatitis, cystitis, GERD, gastroenteritis.   Co morbidities that complicate the patient evaluation  Hypertension, COPD, CAD   Additional history obtained:  External records from outside source obtained and reviewed including previous provider notes, labs, and imaging   Lab Tests:  I Ordered, and personally interpreted labs.  The pertinent results include:   Leukocytosis at 12.4 Lipase 427 Transaminitis with elevated AST and ALT, elevated bilirubin   Imaging Studies ordered:  I ordered imaging studies  including CT abdomen pelvis Imaging pending at time of handoff   Medicines ordered and prescription drug management:  I ordered medication including normal saline Reevaluation of the patient after these medicines showed that the patient stayed the same I have reviewed the patients home medicines and have made adjustments as needed   Test Considered:  Right upper quadrant ultrasound considered, will defer until CT abdomen pelvis is obtained  Problem List / ED Course:  Epigastric abdominal pain Pain improved after he took oxycodone at home.  On my assessment abdomen soft, nondistended, tenderness to right upper quadrant and epigastric area. Lipase elevated at 427 concern for possible acute pancreatitis AST, ALT, and total bili elevated concern for possible cholecystitis. CT abdomen pelvis ordered and pending at this time. Would plan to reach out to on-call of our gastroenterologist due to patient's recent surgery.  Disposition pending imaging and consult with GI. Patient care transferred to PA Soot at the end of my shift. Patient presentation, ED course, and plan of care discussed with review of all pertinent labs and imaging. Please see his/her note for further details regarding further ED course and disposition.            Final Clinical Impression(s) / ED Diagnoses Final diagnoses:  None    Rx / DC Orders ED Discharge Orders     None         Dyann Ruddle 12/14/21 1536    Milton Ferguson, MD 12/17/21 [phone]

## 2021-12-14 NOTE — ED Provider Notes (Signed)
Care handoff from [facility], PA-C at shift change. Please see their note for further information.  Briefly: Patient with ERCP and ampullary adenoma resection on 1/16 with [facility] Dr. Rush Landmark presents today with acute onset epigastric pain at 11am this morning after eating a heavy meal last night  Ddx: pancreatitis, cystitis, GERD, gastroenteritis  Plan: Lipase 427, WBC 12.4, elevated AST/ALT and T. Bili  CT abdomen pelvis pending.  I, Lavonna Rua, PA-C, personally reviewed and evaluated these images and lab results supported by medical decision making   CT without evidence of acute abnormality, no evidence of inflammation surrounding the pancreas  Upon my assessment, patient states that he is currently pain free and would like to follow-up with [facility] outpatient with pain meds and clear liquid diet. Will consult GI for further recommendations.  Discussed patient with [facility] Dr. Benson Norway who states that patient is stable for discharge with pain meds and clear liquid diet and for him to call Dr. Rush Landmark tomorrow to schedule an appointment for further evaluation and ercommendations. Patient is amenable with plan. Will d/c with pain meds. He is educated about red flag symptoms that would prompt immediate return. Discharged in stable condition.   Nestor Lewandowsky 12/14/21 1857    Lorelle Gibbs, DO 12/15/21 2157

## 2021-12-14 NOTE — Discharge Instructions (Signed)
As we discussed, please take pain meds as needed as prescribed for severe pain. Please do not drive or operate heavy machinery after taking this medication as it is a narcotic. As Dr. Benson Norway recommended, please maintain on a clear liquid diet and slowly transition from this as you are able over the next few days. Call Dr. Rush Landmark tomorrow for further recommendations and management.  Return if development of any new or worsening symptoms.

## 2021-12-15 ENCOUNTER — Other Ambulatory Visit: Payer: Self-pay

## 2021-12-15 MED ORDER — AMOXICILL-CLARITHRO-LANSOPRAZ PO MISC: Freq: Two times a day (BID) | ORAL | 0 refills | Status: DC

## 2021-12-15 NOTE — Telephone Encounter (Signed)
Inbound call from patient following up on below message. States he also had 2 bowel movements today. The 1st was solid and normal. Second bowel movement that was more diarrhea and was black when wiped. States happened after eating soup and drinking coffee.

## 2021-12-16 ENCOUNTER — Other Ambulatory Visit (INDEPENDENT_AMBULATORY_CARE_PROVIDER_SITE_OTHER): Payer: BC Managed Care – PPO

## 2021-12-16 ENCOUNTER — Ambulatory Visit (INDEPENDENT_AMBULATORY_CARE_PROVIDER_SITE_OTHER)
Admission: RE | Admit: 2021-12-16 | Discharge: 2021-12-16 | Disposition: A | Discharge status: Home / Self Care | Payer: BC Managed Care – PPO | Source: Ambulatory Visit | Attending: Gastroenterology | Admitting: Gastroenterology

## 2021-12-16 ENCOUNTER — Telehealth: Payer: Self-pay | Admitting: Gastroenterology

## 2021-12-16 ENCOUNTER — Other Ambulatory Visit: Payer: Self-pay

## 2021-12-16 DIAGNOSIS — R1010 Upper abdominal pain, unspecified: Secondary | ICD-10-CM

## 2021-12-16 DIAGNOSIS — R109 Unspecified abdominal pain: Secondary | ICD-10-CM

## 2021-12-16 LAB — CBC WITH DIFFERENTIAL/PLATELET
Basophils Absolute: 0.1 10*3/uL (ref 0.0–0.1)
Basophils Relative: 1.2 % (ref 0.0–3.0)
Eosinophils Absolute: 0.2 10*3/uL (ref 0.0–0.7)
Eosinophils Relative: 4.1 % (ref 0.0–5.0)
HCT: 38.5 % — ABNORMAL LOW (ref 39.0–52.0)
Hemoglobin: 13 g/dL (ref 13.0–17.0)
Lymphocytes Relative: 16.7 % (ref 12.0–46.0)
Lymphs Abs: 0.9 10*3/uL (ref 0.7–4.0)
MCHC: 33.8 g/dL (ref 30.0–36.0)
MCV: 91.2 fl (ref 78.0–100.0)
Monocytes Absolute: 0.9 10*3/uL (ref 0.1–1.0)
Monocytes Relative: 15.5 % — ABNORMAL HIGH (ref 3.0–12.0)
Neutro Abs: 3.5 10*3/uL (ref 1.4–7.7)
Neutrophils Relative %: 62.5 % (ref 43.0–77.0)
Platelets: 428 10*3/uL — ABNORMAL HIGH (ref 150.0–400.0)
RBC: 4.22 Mil/uL (ref 4.22–5.81)
RDW: 12.5 % (ref 11.5–15.5)
WBC: 5.6 10*3/uL (ref 4.0–10.5)

## 2021-12-16 LAB — AMYLASE: Amylase: 18 U/L — ABNORMAL LOW (ref 27–131)

## 2021-12-16 LAB — COMPREHENSIVE METABOLIC PANEL
ALT: 832 U/L — ABNORMAL HIGH (ref 0–53)
AST: 856 U/L — ABNORMAL HIGH (ref 0–37)
Albumin: 3.8 g/dL (ref 3.5–5.2)
Alkaline Phosphatase: 379 U/L — ABNORMAL HIGH (ref 39–117)
BUN: 15 mg/dL (ref 6–23)
CO2: 29 mEq/L (ref 19–32)
Calcium: 9 mg/dL (ref 8.4–10.5)
Chloride: 95 mEq/L — ABNORMAL LOW (ref 96–112)
Creatinine, Ser: 0.99 mg/dL (ref 0.40–1.50)
GFR: 83.75 mL/min (ref >60.00)
Glucose, Bld: 190 mg/dL — ABNORMAL HIGH (ref 70–99)
Potassium: 3.3 mEq/L — ABNORMAL LOW (ref 3.5–5.1)
Sodium: 132 mEq/L — ABNORMAL LOW (ref 135–145)
Total Bilirubin: 4.5 mg/dL — ABNORMAL HIGH (ref 0.2–1.2)
Total Protein: 6.3 g/dL (ref 6.0–8.3)

## 2021-12-16 LAB — C-REACTIVE PROTEIN: CRP: 1.3 mg/dL (ref 0.5–20.0)

## 2021-12-16 LAB — LIPASE: Lipase: 24 U/L (ref 11.0–59.0)

## 2021-12-16 LAB — SEDIMENTATION RATE: Sed Rate: 17 mm/hr (ref 0–20)

## 2021-12-16 NOTE — Telephone Encounter (Signed)
I called the pt and he states that last night he had the same pain as mentioned in the on call note.  He was up until 4 am with abd pain, cold sweats, and nausea with once episode of vomiting.  He will come in for labs and KUB this afternoon and was scheduled for office visit tomorrow at 3:50 pm.

## 2021-12-16 NOTE — Progress Notes (Signed)
Labs & CT orders placed.

## 2021-12-16 NOTE — Telephone Encounter (Signed)
Craig Hopkins,    Please see late afternoon message from nursing yesterday routed to you re: patient pain and ED visit over weekend.      He called the answering service at 2:30AM stating he had another episode of pain ovn, lasted about an hour. No fever, vomiting, or hematochezia.  Wanted to know if he could take a second dose of percocet if needed for further pain episodes.  My advice was that he could do so if one 5/325mg  tablet did not give sufficient relief within an hour.  Told him I would pass this message to you for further advice.  - H. Danis

## 2021-12-16 NOTE — Telephone Encounter (Signed)
I called the patient this evening after his lab results returned.  Interestingly, I did not receive any actual note or call from the lab but rather I was reviewing things this evening.  In brief, this is a patient who came in last week for an EUS/ERCP to attempt ampullary adenoma resection.  The procedure notes are in the chart.  We were able to reaccessed the biliary tree and leave a bile duct stent but could not reaccessed the pancreas duct to leave a prophylactic stent after ampullectomy.  He was given some pain medications so that we could try and keep him out of the hospital if possible.  His pain improved within a couple days and by [place] he was doing better.  Unfortunately he had some rich food/diet over the weekend and ended up coming into the hospital Sunday due to progressive pain.  He underwent a CT scan that was unremarkable but his lipase was elevated and he had a slight elevation in his transferases and bilirubin up to 1.7.  He left the hospital after discussion with the on-call covering for [place].  He followed up yesterday and ended up calling overnight on Tuesday morning to covering [place] with persistent issues of abdominal pain.  He describes also today experiencing progressive darkened urine that has maintained itself and although he is not noting any jaundice he is concerned about that as well as chills.  He has not had any fevers.  I had him come in for labs as well as a KUB to evaluate his current status as well as his bile duct stent.  Labs show a progressive increase in LFTs and bilirubin elevation into the fours with alk phos elevation to the 300s and transferase in the 800s.  His x-ray has not finalized in interview but my wet read shows that the bile duct stent looks unchanged.  It is interesting that his CT scan from 2 days ago showed no evidence of pancreatitis and there is absolutely no bile duct dilation.  The findings on today's labs are concerning for biliary stent  dysfunction versus acute hepatitis versus portal vein thrombosis.  Based on his clinical history biliary obstruction seems more likely but again he now has normal lipase no inflammatory markers and a CT scan 2 days ago showed no bile duct dilation.  Something is off.  I discussed the consideration of coming into the hospital tonight or early tomorrow versus direct admission tomorrow afternoon.  Based on the rapid rise of his LFTs and previous history of chills although no fevers today, I think it is most important for the patient come in by tomorrow morning.  He will need additional work-up as outlined below.  Patient when he comes into the ED, the inpatient GI team or on-call GI team should be updated about the patient being in.  He should immediately undergo a CBC/CMP/acute hepatitis panel/lipase/amylase.  He should undergo repeat cross-sectional imaging either a CT scan or an MRI/MRCP to evaluate if any bile duct dilation has occurred and to evaluate for pneumobilia that would suggest stent patency.  If the patient has new biliary duct obstruction, then the stent will likely need to be interrogated and removed via ERCP.  He is on Plavix and took a dose on Tuesday but since he has a bile duct stent in place and has had a previous biliary stricturotomy as well as pancreatic stent durotomy I think it is okay if he needs endoscopic reevaluation for him to maintain his Plavix but  if he can hold it for the emergency department/medicine team on Wednesday that would be helpful as well.  He likely will come into the [facility] emergency department but could come into the [facility] emergency department and he will let us know.  I have gone in great depth and conversed with him for over 20 minutes this evening to go over these findings of his results and his clinical history.  It is confusing that.  Obstruction was not noted a week after his ampullectomy and he had no evidence of biliary dilation and why he is  having pancreatitis now is unclear as most post ERCP pancreatitis occurs within the first 24 to 36 hours.  The inpatient GI [facility] team will evaluate the patient and I will let them know about him coming into the hospital as well.  Emergency department team can secure chat me or call if there are urgent questions overnight but I have outlined everything above in terms of his neck steps in work-up.  He should be placed on maintenance fluids and given supplemental electrolytes as needed and n.p.o. until he is seen by the inpatient GI team for follow-up.   Justice Britain, MD [facility] Office # [phone]

## 2021-12-16 NOTE — Telephone Encounter (Signed)
Weird for patient to have pancreatitis without imaging findings of it and for this to be lasting this long after his ERCP. Patty, please check in on patient. If issues are still occurring, then we may need to have him come in for additional labs CBC/CMP/Lipase/Amylase and repeat a KUB.   Not clear why this would be occurring at this point in time. Continue to push fluids. If severe pain then needs to repeat CT-Abdomen. Can add-on for clinic tomorrow PM at end of day if needed. Thanks. GM

## 2021-12-16 NOTE — Telephone Encounter (Signed)
Add on an ESR and CRP as well please. Thanks. Put in an order for CT-Abdomen as well, but we will wait to schedule this until he is seen in clinic. Thanks.

## 2021-12-17 ENCOUNTER — Inpatient Hospital Stay (HOSPITAL_COMMUNITY): Payer: BC Managed Care – PPO

## 2021-12-17 ENCOUNTER — Inpatient Hospital Stay (HOSPITAL_COMMUNITY)
Admission: AD | Admit: 2021-12-17 | Discharge: 2021-12-18 | DRG: 392 | Disposition: A | Discharge status: Home / Self Care | Payer: BC Managed Care – PPO | Source: Ambulatory Visit | Attending: Internal Medicine | Admitting: Internal Medicine

## 2021-12-17 ENCOUNTER — Ambulatory Visit: Payer: BC Managed Care – PPO | Admitting: Gastroenterology

## 2021-12-17 DIAGNOSIS — K219 Gastro-esophageal reflux disease without esophagitis: Secondary | ICD-10-CM | POA: Diagnosis present

## 2021-12-17 DIAGNOSIS — Z9889 Other specified postprocedural states: Secondary | ICD-10-CM | POA: Diagnosis not present

## 2021-12-17 DIAGNOSIS — E7800 Pure hypercholesterolemia, unspecified: Secondary | ICD-10-CM | POA: Diagnosis present

## 2021-12-17 DIAGNOSIS — K831 Obstruction of bile duct: Secondary | ICD-10-CM

## 2021-12-17 DIAGNOSIS — E871 Hypo-osmolality and hyponatremia: Secondary | ICD-10-CM | POA: Diagnosis present

## 2021-12-17 DIAGNOSIS — D72828 Other elevated white blood cell count: Secondary | ICD-10-CM | POA: Diagnosis present

## 2021-12-17 DIAGNOSIS — K3184 Gastroparesis: Secondary | ICD-10-CM | POA: Diagnosis present

## 2021-12-17 DIAGNOSIS — Z794 Long term (current) use of insulin: Secondary | ICD-10-CM | POA: Diagnosis not present

## 2021-12-17 DIAGNOSIS — K8500 Idiopathic acute pancreatitis without necrosis or infection: Secondary | ICD-10-CM

## 2021-12-17 DIAGNOSIS — K5900 Constipation, unspecified: Secondary | ICD-10-CM | POA: Diagnosis present

## 2021-12-17 DIAGNOSIS — E10319 Type 1 diabetes mellitus with unspecified diabetic retinopathy without macular edema: Secondary | ICD-10-CM | POA: Diagnosis present

## 2021-12-17 DIAGNOSIS — R17 Unspecified jaundice: Secondary | ICD-10-CM

## 2021-12-17 DIAGNOSIS — I10 Essential (primary) hypertension: Secondary | ICD-10-CM | POA: Diagnosis present

## 2021-12-17 DIAGNOSIS — E1043 Type 1 diabetes mellitus with diabetic autonomic (poly)neuropathy: Secondary | ICD-10-CM | POA: Diagnosis present

## 2021-12-17 DIAGNOSIS — Z7982 Long term (current) use of aspirin: Secondary | ICD-10-CM

## 2021-12-17 DIAGNOSIS — Z955 Presence of coronary angioplasty implant and graft: Secondary | ICD-10-CM | POA: Diagnosis not present

## 2021-12-17 DIAGNOSIS — E669 Obesity, unspecified: Secondary | ICD-10-CM | POA: Diagnosis present

## 2021-12-17 DIAGNOSIS — Z9641 Presence of insulin pump (external) (internal): Secondary | ICD-10-CM | POA: Diagnosis present

## 2021-12-17 DIAGNOSIS — R1013 Epigastric pain: Secondary | ICD-10-CM

## 2021-12-17 DIAGNOSIS — R7989 Other specified abnormal findings of blood chemistry: Secondary | ICD-10-CM | POA: Diagnosis present

## 2021-12-17 DIAGNOSIS — R1010 Upper abdominal pain, unspecified: Principal | ICD-10-CM | POA: Diagnosis present

## 2021-12-17 DIAGNOSIS — E785 Hyperlipidemia, unspecified: Secondary | ICD-10-CM | POA: Diagnosis present

## 2021-12-17 DIAGNOSIS — Z6832 Body mass index (BMI) 32.0-32.9, adult: Secondary | ICD-10-CM

## 2021-12-17 DIAGNOSIS — N400 Enlarged prostate without lower urinary tract symptoms: Secondary | ICD-10-CM | POA: Diagnosis present

## 2021-12-17 DIAGNOSIS — I2510 Atherosclerotic heart disease of native coronary artery without angina pectoris: Secondary | ICD-10-CM | POA: Diagnosis present

## 2021-12-17 DIAGNOSIS — Z79899 Other long term (current) drug therapy: Secondary | ICD-10-CM

## 2021-12-17 DIAGNOSIS — F410 Panic disorder [episodic paroxysmal anxiety] without agoraphobia: Secondary | ICD-10-CM | POA: Diagnosis present

## 2021-12-17 DIAGNOSIS — Z8249 Family history of ischemic heart disease and other diseases of the circulatory system: Secondary | ICD-10-CM

## 2021-12-17 DIAGNOSIS — F17290 Nicotine dependence, other tobacco product, uncomplicated: Secondary | ICD-10-CM | POA: Diagnosis present

## 2021-12-17 DIAGNOSIS — Z4659 Encounter for fitting and adjustment of other gastrointestinal appliance and device: Secondary | ICD-10-CM | POA: Diagnosis not present

## 2021-12-17 DIAGNOSIS — E876 Hypokalemia: Secondary | ICD-10-CM | POA: Diagnosis present

## 2021-12-17 DIAGNOSIS — E1065 Type 1 diabetes mellitus with hyperglycemia: Secondary | ICD-10-CM | POA: Diagnosis present

## 2021-12-17 DIAGNOSIS — B179 Acute viral hepatitis, unspecified: Secondary | ICD-10-CM | POA: Diagnosis present

## 2021-12-17 DIAGNOSIS — Z20822 Contact with and (suspected) exposure to covid-19: Secondary | ICD-10-CM | POA: Diagnosis present

## 2021-12-17 DIAGNOSIS — K859 Acute pancreatitis without necrosis or infection, unspecified: Secondary | ICD-10-CM | POA: Diagnosis present

## 2021-12-17 DIAGNOSIS — J449 Chronic obstructive pulmonary disease, unspecified: Secondary | ICD-10-CM | POA: Diagnosis present

## 2021-12-17 DIAGNOSIS — D135 Benign neoplasm of extrahepatic bile ducts: Secondary | ICD-10-CM

## 2021-12-17 DIAGNOSIS — Z823 Family history of stroke: Secondary | ICD-10-CM

## 2021-12-17 DIAGNOSIS — Z7902 Long term (current) use of antithrombotics/antiplatelets: Secondary | ICD-10-CM | POA: Diagnosis not present

## 2021-12-17 LAB — ACETAMINOPHEN LEVEL: Acetaminophen (Tylenol), Serum: <10 ug/mL — ABNORMAL LOW (ref 10–30)

## 2021-12-17 LAB — COMPREHENSIVE METABOLIC PANEL
ALT: 714 U/L — ABNORMAL HIGH (ref 0–44)
AST: 420 U/L — ABNORMAL HIGH (ref 15–41)
Albumin: 3.9 g/dL (ref 3.5–5.0)
Alkaline Phosphatase: 376 U/L — ABNORMAL HIGH (ref 38–126)
Anion gap: 10 (ref 5–15)
BUN: 10 mg/dL (ref 6–20)
CO2: 25 mmol/L (ref 22–32)
Calcium: 8.8 mg/dL — ABNORMAL LOW (ref 8.9–10.3)
Chloride: 97 mmol/L — ABNORMAL LOW (ref 98–111)
Creatinine, Ser: 0.8 mg/dL (ref 0.61–1.24)
GFR, Estimated: >60 mL/min (ref >60)
Glucose, Bld: 226 mg/dL — ABNORMAL HIGH (ref 70–99)
Potassium: 3.4 mmol/L — ABNORMAL LOW (ref 3.5–5.1)
Sodium: 132 mmol/L — ABNORMAL LOW (ref 135–145)
Total Bilirubin: 2.1 mg/dL — ABNORMAL HIGH (ref 0.3–1.2)
Total Protein: 7.1 g/dL (ref 6.5–8.1)

## 2021-12-17 LAB — CBC WITH DIFFERENTIAL/PLATELET
Abs Immature Granulocytes: 0.03 10*3/uL (ref 0.00–0.07)
Basophils Absolute: 0.1 10*3/uL (ref 0.0–0.1)
Basophils Relative: 2 %
Eosinophils Absolute: 0.2 10*3/uL (ref 0.0–0.5)
Eosinophils Relative: 4 %
HCT: 38.4 % — ABNORMAL LOW (ref 39.0–52.0)
Hemoglobin: 13.8 g/dL (ref 13.0–17.0)
Immature Granulocytes: 1 %
Lymphocytes Relative: 24 %
Lymphs Abs: 1.3 10*3/uL (ref 0.7–4.0)
MCH: 31.8 pg (ref 26.0–34.0)
MCHC: 35.9 g/dL (ref 30.0–36.0)
MCV: 88.5 fL (ref 80.0–100.0)
Monocytes Absolute: 0.8 10*3/uL (ref 0.1–1.0)
Monocytes Relative: 15 %
Neutro Abs: 3.1 10*3/uL (ref 1.7–7.7)
Neutrophils Relative %: 54 %
Platelets: 459 10*3/uL — ABNORMAL HIGH (ref 150–400)
RBC: 4.34 MIL/uL (ref 4.22–5.81)
RDW: 11.9 % (ref 11.5–15.5)
WBC: 5.7 10*3/uL (ref 4.0–10.5)
nRBC: 0 % (ref 0.0–0.2)

## 2021-12-17 LAB — LACTIC ACID, PLASMA: Lactic Acid, Venous: 1.1 mmol/L (ref 0.5–1.9)

## 2021-12-17 LAB — PROTIME-INR
INR: 0.9 (ref 0.8–1.2)
Prothrombin Time: 12.4 seconds (ref 11.4–15.2)

## 2021-12-17 LAB — ETHANOL: Alcohol, Ethyl (B): <10 mg/dL (ref <10)

## 2021-12-17 LAB — AMMONIA: Ammonia: 29 umol/L (ref 9–35)

## 2021-12-17 LAB — GLUCOSE, CAPILLARY: Glucose-Capillary: 174 mg/dL — ABNORMAL HIGH (ref 70–99)

## 2021-12-17 LAB — HIV ANTIBODY (ROUTINE TESTING W REFLEX): HIV Screen 4th Generation wRfx: NONREACTIVE

## 2021-12-17 LAB — LIPASE, BLOOD: Lipase: 33 U/L (ref 11–51)

## 2021-12-17 LAB — MAGNESIUM: Magnesium: 1.9 mg/dL (ref 1.7–2.4)

## 2021-12-17 MED ORDER — DOCUSATE SODIUM 100 MG PO CAPS
100.0000 mg | ORAL_CAPSULE | Freq: Two times a day (BID) | ORAL | Status: DC
  Administered 2021-12-17: 21:00:00 100 mg via ORAL
  Filled 2021-12-17: qty 1

## 2021-12-17 MED ORDER — HYDROMORPHONE HCL 1 MG/ML IJ SOLN: 0.5000 mg | INTRAMUSCULAR | Status: DC | PRN

## 2021-12-17 MED ORDER — ONDANSETRON HCL 4 MG PO TABS: 4.0000 mg | ORAL_TABLET | Freq: Four times a day (QID) | ORAL | Status: DC | PRN

## 2021-12-17 MED ORDER — OXYCODONE HCL 5 MG PO TABS: 5.0000 mg | ORAL_TABLET | ORAL | Status: DC | PRN

## 2021-12-17 MED ORDER — ONDANSETRON HCL 4 MG/2ML IJ SOLN: 4.0000 mg | Freq: Four times a day (QID) | INTRAMUSCULAR | Status: DC | PRN

## 2021-12-17 MED ORDER — INSULIN PUMP
SUBCUTANEOUS | Status: DC
  Filled 2021-12-17: qty 1

## 2021-12-17 MED ORDER — POLYETHYLENE GLYCOL 3350 17 G PO PACK: 17.0000 g | PACK | Freq: Every day | ORAL | Status: DC | PRN

## 2021-12-17 MED ORDER — ACETAMINOPHEN 325 MG PO TABS: 650.0000 mg | ORAL_TABLET | Freq: Four times a day (QID) | ORAL | Status: DC | PRN

## 2021-12-17 MED ORDER — SUCRALFATE 1 G PO TABS
1.0000 g | ORAL_TABLET | Freq: Two times a day (BID) | ORAL | Status: DC
  Administered 2021-12-17: 21:00:00 1 g via ORAL
  Filled 2021-12-17: qty 1

## 2021-12-17 MED ORDER — ACETAMINOPHEN 650 MG RE SUPP: 650.0000 mg | Freq: Four times a day (QID) | RECTAL | Status: DC | PRN

## 2021-12-17 MED ORDER — LACTATED RINGERS IV SOLN: INTRAVENOUS | Status: DC

## 2021-12-17 MED ORDER — POLYVINYL ALCOHOL 1.4 % OP SOLN
1.0000 [drp] | Freq: Every day | OPHTHALMIC | Status: DC | PRN
  Filled 2021-12-17: qty 15

## 2021-12-17 MED ORDER — ALPRAZOLAM 0.25 MG PO TABS: 0.2500 mg | ORAL_TABLET | Freq: Every evening | ORAL | Status: DC | PRN

## 2021-12-17 MED ORDER — PANTOPRAZOLE SODIUM 40 MG PO TBEC: 40.0000 mg | DELAYED_RELEASE_TABLET | Freq: Every day | ORAL | Status: DC

## 2021-12-17 NOTE — Assessment & Plan Note (Addendum)
Placing the patient has a poor outcome.   BMI 32 in September.

## 2021-12-17 NOTE — Assessment & Plan Note (Addendum)
Patient is on insulin pump. Follows up with Dr. Forde Dandy. Does not want to come off of the insulin pump and understand the risk for hypoglycemia. Has been managing his n.p.o. status for ERCP and all other procedure outpatient. Continue pump for now.

## 2021-12-17 NOTE — H&P (View-Only) (Signed)
Gastroenterology Inpatient Consultation   Attending Requesting Consult Lavina Hamman, [place] Hospital Day: 1  Reason for Consult Abnormal LFTs in setting of recent endoscopic ampullectomy and biliary stent placement    History of Present Illness  Craig Hopkins is a 61 y.o. 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 pylori gastritis (has not begun treatment).  The GI service is consulted for evaluation and management of abnormal LFTs, jaundice, recent pancreatitis (a week after endoscopic resection).  Please see my last progress note for full details of our discussion back in October. I performed an endoscopic ampullectomy with endoscopic ultrasound on 12/08/2021.  The patient underwent piecemeal resection.  Although I had performed both a biliary and pancreatic sphincterotomy, I could not reenter the pancreatic duct and so I could not leave a prophylactic stent.  I did leave a biliary stent to decrease the risk of cholangitis.  Patient was able to be discharged with a few days worth of opioid therapy in an effort of trying to prevent him from needing to come into the hospital if he had significant pain.  Patient describes over the course the next couple days having some abdominal discomfort that did require opioid therapy but for which by Friday he was feeling well and doing well.  The patient was found to have H. pylori gastritis and he was going to be initiated on Pylera per his primary gastroenterologist, Dr. Henrene Pastor.  He has not initiated that treatment as of yet.  Over the weekend, he developed significant abdominal pain that brought him into the hospital.  He came in and was seen in the emergency department.  He was found to have a lipase elevation as well as abdominal pain in the midepigastrium and right  upper quadrant.  He was given a clinical diagnosis of pancreatitis.  His imaging which included a cross-sectional CT showed no evidence of pancreatitis, interestingly.  As he was pain-free at the time, he was discharged and he was told to follow-up on Monday.  On Monday, he had a single episode of dark stool and had recurrence of some abdominal pain as well that required pain medication.  He called the on-call [place] service and he was told to follow-up on Tuesday.  I had him come in for labs that showed a new acute hepatitis and bilirubin elevation into the fours.  After I receive these labs I called him last night and he describes having progressive dark urine for the last 2 days.  I subsequently told the patient to come into the emergency department today versus coming in for direct admission.  He was supposed to come to clinic this afternoon but because a bed became available he came into the hospital directly.  I am seeing the patient this afternoon.  His hemodynamics are stable as outlined below.  He has not had any recurrent pain since Monday.  He feels his urine is slightly less dark today.  He has not had a bowel movement for 2 days.  He normally has bowel movements on a daily basis.  He was able to eat a little bit of eggs this morning after having been on a low-fat liquid diet for the last few days prior.  He has not had any recurrence of pain currently.  He feels constitutionally unwell and sick but again compared to where  he was on Sunday when he came to the emergency department he is better.  He is accompanied by his girlfriend as well as his sister.  His last dose of Plavix was on 1/24.  He took all of his normal medications today other than Plavix.  GI Review of Systems Positive as above including nausea Negative for dysphagia, odynophagia, vomiting, hematochezia   Review of Systems  General: Denies fevers/chills/weight loss unintentionally HEENT: Denies oral lesions/sore  throat Cardiovascular: Denies chest pain Pulmonary: Denies shortness of breath Gastroenterological: See HPI Genitourinary: Denies hematuria Hematological: Positive for history of easy bruising/bleeding due to his Plavix use Dermatological: Positive for jaundice as noted above; denies pruritus Psychological: Mood is stable   Histories  Past Medical History Past Medical History:  Diagnosis Date   CAD (coronary artery disease)    COPD (chronic obstructive pulmonary disease) (HCC)    GERD (gastroesophageal reflux disease)    HTN (hypertension)    Hypercholesteremia    Hyperlipidemia    IDDM (insulin dependent diabetes mellitus)    Neuromuscular disorder (HCC)    Panic attack    Past Surgical History:  Procedure Laterality Date   BILIARY DILATION  12/08/2021   Procedure: BILIARY DILATION;  Surgeon: Rush Landmark Telford Nab., MD;  Location: [facility];  Service: Gastroenterology;;   BILIARY STENT PLACEMENT N/A 12/08/2021   Procedure: BILIARY STENT PLACEMENT;  Surgeon: Irving Copas., MD;  Location: [facility];  Service: Gastroenterology;  Laterality: N/A;   BIOPSY  12/08/2021   Procedure: BIOPSY;  Surgeon: Irving Copas., MD;  Location: [facility];  Service: Gastroenterology;;   CAROTID STENT  11/23/2006   COLONOSCOPY     ENDOSCOPIC MUCOSAL RESECTION  12/08/2021   Procedure: ENDOSCOPIC MUCOSAL RESECTION;  Surgeon: Rush Landmark Telford Nab., MD;  Location: [facility];  Service: Gastroenterology;;  Ampulectomy   ENDOSCOPIC RETROGRADE CHOLANGIOPANCREATOGRAPHY (ERCP) WITH PROPOFOL N/A 12/08/2021   Procedure: ENDOSCOPIC RETROGRADE CHOLANGIOPANCREATOGRAPHY (ERCP) WITH PROPOFOL;  Surgeon: Irving Copas., MD;  Location: [facility];  Service: Gastroenterology;  Laterality: N/A;   ESOPHAGOGASTRODUODENOSCOPY (EGD) WITH PROPOFOL N/A 12/08/2021   Procedure: ESOPHAGOGASTRODUODENOSCOPY (EGD) WITH PROPOFOL;  Surgeon: Rush Landmark Telford Nab., MD;  Location: [facility];   Service: Gastroenterology;  Laterality: N/A;   EUS N/A 12/08/2021   Procedure: UPPER ENDOSCOPIC ULTRASOUND (EUS) LINEAR;  Surgeon: Irving Copas., MD;  Location: [facility];  Service: Gastroenterology;  Laterality: N/A;   heart catherization  11/23/2009   HEMOSTASIS CONTROL  12/08/2021   Procedure: HEMOSTASIS CONTROL;  Surgeon: Irving Copas., MD;  Location: [facility];  Service: Gastroenterology;;   left wrist  11/23/1974   SPHINCTEROTOMY  12/08/2021   Procedure: Joan Mayans;  Surgeon: Mansouraty, Telford Nab., MD;  Location: [facility];  Service: Gastroenterology;;  Biliary   SPHINCTEROTOMY  12/08/2021   Procedure: Joan Mayans;  Surgeon: Mansouraty, Telford Nab., MD;  Location: [facility];  Service: Gastroenterology;;  Pancreatic   SUBMUCOSAL LIFTING INJECTION  12/08/2021   Procedure: SUBMUCOSAL LIFTING INJECTION;  Surgeon: Irving Copas., MD;  Location: [facility];  Service: Gastroenterology;;   throat polyp removed  11/23/1994   TONSILLECTOMY  11/23/1998   UPPER GASTROINTESTINAL ENDOSCOPY      Allergies No Known Allergies  Family History Family History  Problem Relation Age of Onset   Breast cancer Mother 29   Coronary artery disease Father    Stroke Father    Cancer Other        family history    Coronary artery disease Other        family  history    Esophageal cancer Neg Hx    Colon cancer Neg Hx    Rectal cancer Neg Hx    Stomach cancer Neg Hx    Inflammatory bowel disease Neg Hx    Liver disease Neg Hx    Pancreatic cancer Neg Hx    The patient's FH is negative for IBD/IBS/Liver Disease/GI Malignancies.  Social History Social History   Socioeconomic History   Marital status: Divorced    Spouse name: Not on file   Number of children: Not on file   Years of education: [employer]   Highest education level: Not on file  Occupational History   Not on file  Tobacco Use   Smoking status: Former   Smokeless tobacco: Former   Scientific laboratory technician Use: Every day  Substance and Sexual Activity   Alcohol use: Yes    Alcohol/week: 1.0 standard drink    Types: 1 Standard drinks or equivalent per week   Drug use: Yes    Types: Marijuana    Comment: last use 1 1/2 weeks ago early sep 2022   Sexual activity: Not on file  Other Topics Concern   Not on file  Social History Narrative   Not on file   Social Determinants of Health   Financial Resource Strain: Not on file  Food Insecurity: Not on file  Transportation Needs: Not on file  Physical Activity: Not on file  Stress: Not on file  Social Connections: Not on file  Intimate Partner Violence: Not on file   The patient denies Drug, Alcohol, or Tobacco use.   Occupation is Travel   Medications  Home Medications No current facility-administered medications on file prior to encounter.   Current Outpatient Medications on File Prior to Encounter  Medication Sig Dispense Refill   ALPRAZolam (XANAX) 0.25 MG tablet Take 0.25 mg by mouth at bedtime as needed for anxiety or sleep.      amLODipine-benazepril (LOTREL) 5-10 MG capsule Take 1 capsule by mouth daily.     amoxicillin-clarithromycin-lansoprazole (PREVPAC) combo pack Take by mouth 2 (two) times daily. Follow package directions. 112 each 0   aspirin EC 81 MG tablet Take 1 tablet (81 mg total) by mouth daily.     atorvastatin (LIPITOR) 40 MG tablet Take 40 mg by mouth daily.     Carboxymethylcellul-Glycerin (CLEAR EYES FOR DRY EYES) 1-0.25 % SOLN Place 1 drop into both eyes daily as needed (dry eyes).     clopidogrel (PLAVIX) 75 MG tablet Take 1 tablet (75 mg total) by mouth daily.     Continuous Blood Gluc Sensor (FREESTYLE LIBRE 2 SENSOR) MISC USE TO CHECK GLUCOSE DAILY - REPLACE SENSOR EVERY 14 DAYS.     hydrochlorothiazide (HYDRODIURIL) 25 MG tablet Take 25 mg by mouth daily.     insulin aspart (NOVOLOG) 100 UNIT/ML injection Inject into the skin See admin instructions. Via Insulin Pump     Insulin  Human (INSULIN PUMP) SOLN Inject into the skin.     Melatonin 5 MG CHEW Chew 5 mg by mouth at bedtime as needed (sleep).     omeprazole (PRILOSEC) 20 MG capsule Take 1 capsule (20 mg total) by mouth daily. 60 capsule 6   oxyCODONE-acetaminophen (PERCOCET/ROXICET) 5-325 MG tablet Take 1 tablet by mouth every 6 (six) hours as needed for severe pain. 15 tablet 0   sucralfate (CARAFATE) 1 g tablet Take 1 tablet (1 g total) by mouth 2 (two) times daily. 60 tablet 0  tadalafil (CIALIS) 5 MG tablet Take 5 mg by mouth daily as needed for erectile dysfunction.     Scheduled Inpatient Medications  docusate sodium  100 mg Oral BID   insulin pump   Subcutaneous Q4H   [START ON 12/18/2021] pantoprazole  40 mg Oral Daily   sucralfate  1 g Oral BID   Continuous Inpatient Infusions  lactated ringers     PRN Inpatient Medications acetaminophen **OR** acetaminophen, ALPRAZolam, HYDROmorphone (DILAUDID) injection, ondansetron **OR** ondansetron (ZOFRAN) IV, oxyCODONE, polyethylene glycol, polyvinyl alcohol   Physical Examination  BP (!) 157/92 (BP Location: Right Arm)    Pulse 95    Temp 98.1 F (36.7 C) (Oral)    Resp 16    SpO2 99%  GEN: NAD, appears stated age, doesn't appear chronically ill, accompanied by girlfriend and sister PSYCH: Cooperative, without pressured speech EYE: Conjunctivae jaundiced ENT: Dry MM, without oral ulcers CV: Nontachycardic RESP: No wheezing appreciated GI: NABS, soft, protuberant abdomen, distended, nontender  MSK/EXT: Trace bilateral lower extremity edema SKIN: Slight jaundice noted today on forehead NEURO:  Alert & Oriented x 3, no focal deficits, no evidence of asterixis   Review of Data  I reviewed the following data at the time of this encounter:  Laboratory Studies   Recent Labs  Lab 12/16/21 1547  NA 132*  K 3.3*  CL 95*  CO2 29  BUN 15  CREATININE 0.99  GLUCOSE 190*  CALCIUM 9.0   Recent Labs  Lab 12/16/21 1547  AST 856*  ALT 832*  ALKPHOS  379*    Recent Labs  Lab 12/14/21 1315 12/16/21 1547  WBC 12.4* 5.6  HGB 15.7 13.0  HCT 45.1 38.5*  PLT 461* 428.0*   No results for input(s): APTT, INR in the last 168 hours.  Imaging Studies  January 2023 CT abdomen pelvis IMPRESSION: 1. No acute process identified. 2. Common bile duct stent in place. No biliary ductal dilatation. 3. Prostatomegaly.   GI Procedures and Studies  EGD Impression: - No gross lesions in esophagus. Z-line irregular, 41 cm from the incisors. - Gastritis. Biopsied. - Mucosal nodule/lesion consistent with known ampullary adenoma found. - No other gross lesions in the duodenal bulb, in the first portion of the duodenum and in the second portion of the duodenum. EUS Impression: - A lesion was found at the ampulla. A tissue diagnosis was obtained prior to this exam. This is consistent with an adenoma. No evidence of this extending into the CBD or PD. - There was no sign of significant pathology in the pancreatic head, genu of the pancreas, pancreatic body and pancreatic tail. - There was no sign of significant pathology in the common bile duct. - No malignant-appearing lymph nodes were visualized in the celiac region (level 20), peripancreatic region and porta hepatis region. January 2023 EUS  January 2023 ERCP - Ampullary adenoma identified and here for attempt at endoscopic resection. - A small biliary sphincterotomy was performed. - A small pancreatic sphincterotomy was performed. - Snare mucosal papillectomy via lifting agent of the major papilla was performed. Resection and retrieval were complete. Piecemeal resection required. - Mild ooze noted a few minutes after resection had been completed. This was injected with 1 mL of 1:100000 epinephrine with good effect. Additional tamponade effect with a balloon at the distal CBD was performed. - One plastic biliary stent was placed into the common bile duct due to sluggish flow and to decrease risk  of cholangitis. - After significant attempts at trying to re-enter  the pancreas duct we could not get into this area. Thus prophylactic stenting was not possible today.  Pathology FINAL MICROSCOPIC DIAGNOSIS:  A. STOMACH, BIOPSY:  - Chronic gastritis with intestinal metaplasia.  No dysplasia or malignancy.  - Scattered organisms suggestive of Helicobacter pylori with Warthin-Starry stain.  B. AMPULLA, AMPULLECTOMY:  - Adenoma.  No high-grade dysplasia or malignancy.  See comment.    Assessment  Craig Hopkins is a 61 y.o. 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 pylori gastritis (has not begun treatment).  The GI service is consulted for evaluation and management of abnormal LFTs, jaundice, recent pancreatitis (a week after endoscopic resection).  The patient is hemodynamically stable.  Clinically is well, he seems to be doing better than he did over the course of the last few days.  I think we have 2 questions at this time, 1 what was the etiology of the patient's pancreatitis clinically this last weekend and second question is what is the etiology of the patient's abnormal LFTs/generalized hepatitis.  1 could theorized that the patient had blood post procedurally then led to a blockage of his biliary stent that subsequently led to the LFTs becoming abnormal or the patient could have a biliary stent dysfunction or he could have progressive biliary issues as a result of progressive pancreatitis.  What interesting is that imaging wise he had no evidence of overt pancreatitis or inflammation as of 2-1/2 days ago.  In either case, I think there is a high chance that we will need to do endoscopic reevaluation of his biliary tree as result of him having a bowel stent in place.  Unless the patient has an acute hepatitis of another  etiology, I think this is the next step in his evaluation.  We will proceed with an abdominal ultrasound liver Doppler in an effort of ensuring that portal flow is good and there is no evidence of Budd-Chiari syndrome and to see if the bile duct has dilated at all over the course the last few days in case we caught him very early during his recent CT scan.  We will see how his liver tests look today and repeat them tomorrow.  He will be n.p.o. after 6 AM tomorrow morning in an effort that whether an ERCP needs to be performed on Thursday or potentially on Friday that he could be available for that (the schedule is quite full for Thursday).  If the patient's LFT pattern significantly improves or is found to have another reason for acute hepatitis, then it may be possible to hold on biliary stent exchange/evaluation.  Time will tell.  The risks of an ERCP were discussed at length, including but not limited to the risk of perforation, bleeding, abdominal pain, post-ERCP pancreatitis (while usually mild can be severe and even life threatening).  The risks and benefits of endoscopic evaluation were discussed with the patient; these include but are not limited to the risk of perforation, infection, bleeding, missed lesions, lack of diagnosis, severe illness requiring hospitalization, as well as anesthesia and sedation related illnesses.  The patient and/or family is agreeable to proceed if it is felt that an ERCP needs to be performed.  All patient questions were answered to the best of my ability, and the patient agrees to the aforementioned plan of action with follow-up as indicated.   Plan/Recommendations  Appreciate medical service admitting  the patient Hold Plavix tonight and tomorrow CMP/ESR/CRP/amylase/lipase at minimum to be obtained Obtain an acute hepatitis panel Liver Doppler ultrasound to be performed Electrolyte repletion as per medical service I am okay for the patient to have a heart healthy diet  up until 6 AM tomorrow and then he will be n.p.o. in case potential ERCP is performed on Thursday IV fluids for maintenance as per medical service If LFT pattern is significantly improved, will consider monitoring Patient will need H. pylori treatment at some point in the future which will be guided by Dr. Henrene Pastor his primary GI   Thank you medicine again for the direct admission of this patient today.  The inpatient GI service will continue to follow.  Please page/call with questions or concerns.   Justice Britain, MD [facility] Office # [phone]

## 2021-12-17 NOTE — Telephone Encounter (Signed)
I am told that the patient needs to be evaluated in clinic before a direct admission can occur.  I have paged the Hospitalist team to see about discussing case and trying to get a direct admission for the patient.  This call is still pending. Patient updated by RN Gerarda Fraction that he will need to come to clinic this afternoon, for [place] to try and make sure he is stable for a hospital admission before they let us otherwise will need to go through the ED.  GM

## 2021-12-17 NOTE — Assessment & Plan Note (Addendum)
On aspirin and Plavix.  Resume per GI.

## 2021-12-17 NOTE — Assessment & Plan Note (Signed)
On amlodipine Lotrel. Will continue amlodipine for now.

## 2021-12-17 NOTE — Telephone Encounter (Signed)
I called and spoke with Dr. Olevia Bowens. We discussed the case. Patient will be evaluated in clinic this afternoon as an add-on to ensure that he is clinically stable for a direct admission. If he is not, then he will be directed to the emergency department. I have updated RN Gerarda Fraction and she will update the patient as well and she will work on updating bed control on the telemetry bed request.  FYI inpatient GI team.   Justice Britain, MD [facility] Advanced [facility]oscopy Office # [phone]

## 2021-12-17 NOTE — Plan of Care (Signed)
°  Problem: Education: Goal: Knowledge of General Education information will improve Description: Including pain rating scale, medication(s)/side effects and non-pharmacologic comfort measures Outcome: Progressing   Problem: Clinical Measurements: Goal: Diagnostic test results will improve Outcome: Progressing   Problem: Clinical Measurements: Goal: Ability to maintain clinical measurements within normal limits will improve Outcome: Progressing

## 2021-12-17 NOTE — Consult Note (Signed)
Gastroenterology Inpatient Consultation   Attending Requesting Consult Lavina Hamman, [place] Hospital Day: 1  Reason for Consult Abnormal LFTs in setting of recent endoscopic ampullectomy and biliary stent placement    History of Present Illness  Craig Hopkins is a 61 y.o. 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 pylori gastritis (has not begun treatment).  The GI service is consulted for evaluation and management of abnormal LFTs, jaundice, recent pancreatitis (a week after endoscopic resection).  Please see my last progress note for full details of our discussion back in October. I performed an endoscopic ampullectomy with endoscopic ultrasound on 12/08/2021.  The patient underwent piecemeal resection.  Although I had performed both a biliary and pancreatic sphincterotomy, I could not reenter the pancreatic duct and so I could not leave a prophylactic stent.  I did leave a biliary stent to decrease the risk of cholangitis.  Patient was able to be discharged with a few days worth of opioid therapy in an effort of trying to prevent him from needing to come into the hospital if he had significant pain.  Patient describes over the course the next couple days having some abdominal discomfort that did require opioid therapy but for which by Friday he was feeling well and doing well.  The patient was found to have H. pylori gastritis and he was going to be initiated on Pylera per his primary gastroenterologist, Dr. Henrene Pastor.  He has not initiated that treatment as of yet.  Over the weekend, he developed significant abdominal pain that brought him into the hospital.  He came in and was seen in the emergency department.  He was found to have a lipase elevation as well as abdominal pain in the midepigastrium and right  upper quadrant.  He was given a clinical diagnosis of pancreatitis.  His imaging which included a cross-sectional CT showed no evidence of pancreatitis, interestingly.  As he was pain-free at the time, he was discharged and he was told to follow-up on Monday.  On Monday, he had a single episode of dark stool and had recurrence of some abdominal pain as well that required pain medication.  He called the on-call [place] service and he was told to follow-up on Tuesday.  I had him come in for labs that showed a new acute hepatitis and bilirubin elevation into the fours.  After I receive these labs I called him last night and he describes having progressive dark urine for the last 2 days.  I subsequently told the patient to come into the emergency department today versus coming in for direct admission.  He was supposed to come to clinic this afternoon but because a bed became available he came into the hospital directly.  I am seeing the patient this afternoon.  His hemodynamics are stable as outlined below.  He has not had any recurrent pain since Monday.  He feels his urine is slightly less dark today.  He has not had a bowel movement for 2 days.  He normally has bowel movements on a daily basis.  He was able to eat a little bit of eggs this morning after having been on a low-fat liquid diet for the last few days prior.  He has not had any recurrence of pain currently.  He feels constitutionally unwell and sick but again compared to where  he was on Sunday when he came to the emergency department he is better.  He is accompanied by his girlfriend as well as his sister.  His last dose of Plavix was on 1/24.  He took all of his normal medications today other than Plavix.  GI Review of Systems Positive as above including nausea Negative for dysphagia, odynophagia, vomiting, hematochezia   Review of Systems  General: Denies fevers/chills/weight loss unintentionally HEENT: Denies oral lesions/sore  throat Cardiovascular: Denies chest pain Pulmonary: Denies shortness of breath Gastroenterological: See HPI Genitourinary: Denies hematuria Hematological: Positive for history of easy bruising/bleeding due to his Plavix use Dermatological: Positive for jaundice as noted above; denies pruritus Psychological: Mood is stable   Histories  Past Medical History Past Medical History:  Diagnosis Date   CAD (coronary artery disease)    COPD (chronic obstructive pulmonary disease) (HCC)    GERD (gastroesophageal reflux disease)    HTN (hypertension)    Hypercholesteremia    Hyperlipidemia    IDDM (insulin dependent diabetes mellitus)    Neuromuscular disorder (HCC)    Panic attack    Past Surgical History:  Procedure Laterality Date   BILIARY DILATION  12/08/2021   Procedure: BILIARY DILATION;  Surgeon: Rush Landmark Telford Nab., MD;  Location: [facility];  Service: Gastroenterology;;   BILIARY STENT PLACEMENT N/A 12/08/2021   Procedure: BILIARY STENT PLACEMENT;  Surgeon: Irving Copas., MD;  Location: [facility];  Service: Gastroenterology;  Laterality: N/A;   BIOPSY  12/08/2021   Procedure: BIOPSY;  Surgeon: Irving Copas., MD;  Location: [facility];  Service: Gastroenterology;;   CAROTID STENT  11/23/2006   COLONOSCOPY     ENDOSCOPIC MUCOSAL RESECTION  12/08/2021   Procedure: ENDOSCOPIC MUCOSAL RESECTION;  Surgeon: Rush Landmark Telford Nab., MD;  Location: [facility];  Service: Gastroenterology;;  Ampulectomy   ENDOSCOPIC RETROGRADE CHOLANGIOPANCREATOGRAPHY (ERCP) WITH PROPOFOL N/A 12/08/2021   Procedure: ENDOSCOPIC RETROGRADE CHOLANGIOPANCREATOGRAPHY (ERCP) WITH PROPOFOL;  Surgeon: Irving Copas., MD;  Location: [facility];  Service: Gastroenterology;  Laterality: N/A;   ESOPHAGOGASTRODUODENOSCOPY (EGD) WITH PROPOFOL N/A 12/08/2021   Procedure: ESOPHAGOGASTRODUODENOSCOPY (EGD) WITH PROPOFOL;  Surgeon: Rush Landmark Telford Nab., MD;  Location: [facility];   Service: Gastroenterology;  Laterality: N/A;   EUS N/A 12/08/2021   Procedure: UPPER ENDOSCOPIC ULTRASOUND (EUS) LINEAR;  Surgeon: Irving Copas., MD;  Location: [facility];  Service: Gastroenterology;  Laterality: N/A;   heart catherization  11/23/2009   HEMOSTASIS CONTROL  12/08/2021   Procedure: HEMOSTASIS CONTROL;  Surgeon: Irving Copas., MD;  Location: [facility];  Service: Gastroenterology;;   left wrist  11/23/1974   SPHINCTEROTOMY  12/08/2021   Procedure: Joan Mayans;  Surgeon: Mansouraty, Telford Nab., MD;  Location: [facility];  Service: Gastroenterology;;  Biliary   SPHINCTEROTOMY  12/08/2021   Procedure: Joan Mayans;  Surgeon: Mansouraty, Telford Nab., MD;  Location: [facility];  Service: Gastroenterology;;  Pancreatic   SUBMUCOSAL LIFTING INJECTION  12/08/2021   Procedure: SUBMUCOSAL LIFTING INJECTION;  Surgeon: Irving Copas., MD;  Location: [facility];  Service: Gastroenterology;;   throat polyp removed  11/23/1994   TONSILLECTOMY  11/23/1998   UPPER GASTROINTESTINAL ENDOSCOPY      Allergies No Known Allergies  Family History Family History  Problem Relation Age of Onset   Breast cancer Mother 29   Coronary artery disease Father    Stroke Father    Cancer Other        family history    Coronary artery disease Other        family  history    Esophageal cancer Neg Hx    Colon cancer Neg Hx    Rectal cancer Neg Hx    Stomach cancer Neg Hx    Inflammatory bowel disease Neg Hx    Liver disease Neg Hx    Pancreatic cancer Neg Hx    The patient's FH is negative for IBD/IBS/Liver Disease/GI Malignancies.  Social History Social History   Socioeconomic History   Marital status: Divorced    Spouse name: Not on file   Number of children: Not on file   Years of education: [employer]   Highest education level: Not on file  Occupational History   Not on file  Tobacco Use   Smoking status: Former   Smokeless tobacco: Former   Scientific laboratory technician Use: Every day  Substance and Sexual Activity   Alcohol use: Yes    Alcohol/week: 1.0 standard drink    Types: 1 Standard drinks or equivalent per week   Drug use: Yes    Types: Marijuana    Comment: last use 1 1/2 weeks ago early sep 2022   Sexual activity: Not on file  Other Topics Concern   Not on file  Social History Narrative   Not on file   Social Determinants of Health   Financial Resource Strain: Not on file  Food Insecurity: Not on file  Transportation Needs: Not on file  Physical Activity: Not on file  Stress: Not on file  Social Connections: Not on file  Intimate Partner Violence: Not on file   The patient denies Drug, Alcohol, or Tobacco use.   Occupation is Travel   Medications  Home Medications No current facility-administered medications on file prior to encounter.   Current Outpatient Medications on File Prior to Encounter  Medication Sig Dispense Refill   ALPRAZolam (XANAX) 0.25 MG tablet Take 0.25 mg by mouth at bedtime as needed for anxiety or sleep.      amLODipine-benazepril (LOTREL) 5-10 MG capsule Take 1 capsule by mouth daily.     amoxicillin-clarithromycin-lansoprazole (PREVPAC) combo pack Take by mouth 2 (two) times daily. Follow package directions. 112 each 0   aspirin EC 81 MG tablet Take 1 tablet (81 mg total) by mouth daily.     atorvastatin (LIPITOR) 40 MG tablet Take 40 mg by mouth daily.     Carboxymethylcellul-Glycerin (CLEAR EYES FOR DRY EYES) 1-0.25 % SOLN Place 1 drop into both eyes daily as needed (dry eyes).     clopidogrel (PLAVIX) 75 MG tablet Take 1 tablet (75 mg total) by mouth daily.     Continuous Blood Gluc Sensor (FREESTYLE LIBRE 2 SENSOR) MISC USE TO CHECK GLUCOSE DAILY - REPLACE SENSOR EVERY 14 DAYS.     hydrochlorothiazide (HYDRODIURIL) 25 MG tablet Take 25 mg by mouth daily.     insulin aspart (NOVOLOG) 100 UNIT/ML injection Inject into the skin See admin instructions. Via Insulin Pump     Insulin  Human (INSULIN PUMP) SOLN Inject into the skin.     Melatonin 5 MG CHEW Chew 5 mg by mouth at bedtime as needed (sleep).     omeprazole (PRILOSEC) 20 MG capsule Take 1 capsule (20 mg total) by mouth daily. 60 capsule 6   oxyCODONE-acetaminophen (PERCOCET/ROXICET) 5-325 MG tablet Take 1 tablet by mouth every 6 (six) hours as needed for severe pain. 15 tablet 0   sucralfate (CARAFATE) 1 g tablet Take 1 tablet (1 g total) by mouth 2 (two) times daily. 60 tablet 0  tadalafil (CIALIS) 5 MG tablet Take 5 mg by mouth daily as needed for erectile dysfunction.     Scheduled Inpatient Medications  docusate sodium  100 mg Oral BID   insulin pump   Subcutaneous Q4H   [START ON 12/18/2021] pantoprazole  40 mg Oral Daily   sucralfate  1 g Oral BID   Continuous Inpatient Infusions  lactated ringers     PRN Inpatient Medications acetaminophen **OR** acetaminophen, ALPRAZolam, HYDROmorphone (DILAUDID) injection, ondansetron **OR** ondansetron (ZOFRAN) IV, oxyCODONE, polyethylene glycol, polyvinyl alcohol   Physical Examination  BP (!) 157/92 (BP Location: Right Arm)    Pulse 95    Temp 98.1 F (36.7 C) (Oral)    Resp 16    SpO2 99%  GEN: NAD, appears stated age, doesn't appear chronically ill, accompanied by girlfriend and sister PSYCH: Cooperative, without pressured speech EYE: Conjunctivae jaundiced ENT: Dry MM, without oral ulcers CV: Nontachycardic RESP: No wheezing appreciated GI: NABS, soft, protuberant abdomen, distended, nontender  MSK/EXT: Trace bilateral lower extremity edema SKIN: Slight jaundice noted today on forehead NEURO:  Alert & Oriented x 3, no focal deficits, no evidence of asterixis   Review of Data  I reviewed the following data at the time of this encounter:  Laboratory Studies   Recent Labs  Lab 12/16/21 1547  NA 132*  K 3.3*  CL 95*  CO2 29  BUN 15  CREATININE 0.99  GLUCOSE 190*  CALCIUM 9.0   Recent Labs  Lab 12/16/21 1547  AST 856*  ALT 832*  ALKPHOS  379*    Recent Labs  Lab 12/14/21 1315 12/16/21 1547  WBC 12.4* 5.6  HGB 15.7 13.0  HCT 45.1 38.5*  PLT 461* 428.0*   No results for input(s): APTT, INR in the last 168 hours.  Imaging Studies  January 2023 CT abdomen pelvis IMPRESSION: 1. No acute process identified. 2. Common bile duct stent in place. No biliary ductal dilatation. 3. Prostatomegaly.   GI Procedures and Studies  EGD Impression: - No gross lesions in esophagus. Z-line irregular, 41 cm from the incisors. - Gastritis. Biopsied. - Mucosal nodule/lesion consistent with known ampullary adenoma found. - No other gross lesions in the duodenal bulb, in the first portion of the duodenum and in the second portion of the duodenum. EUS Impression: - A lesion was found at the ampulla. A tissue diagnosis was obtained prior to this exam. This is consistent with an adenoma. No evidence of this extending into the CBD or PD. - There was no sign of significant pathology in the pancreatic head, genu of the pancreas, pancreatic body and pancreatic tail. - There was no sign of significant pathology in the common bile duct. - No malignant-appearing lymph nodes were visualized in the celiac region (level 20), peripancreatic region and porta hepatis region. January 2023 EUS  January 2023 ERCP - Ampullary adenoma identified and here for attempt at endoscopic resection. - A small biliary sphincterotomy was performed. - A small pancreatic sphincterotomy was performed. - Snare mucosal papillectomy via lifting agent of the major papilla was performed. Resection and retrieval were complete. Piecemeal resection required. - Mild ooze noted a few minutes after resection had been completed. This was injected with 1 mL of 1:100000 epinephrine with good effect. Additional tamponade effect with a balloon at the distal CBD was performed. - One plastic biliary stent was placed into the common bile duct due to sluggish flow and to decrease risk  of cholangitis. - After significant attempts at trying to re-enter  the pancreas duct we could not get into this area. Thus prophylactic stenting was not possible today.  Pathology FINAL MICROSCOPIC DIAGNOSIS:  A. STOMACH, BIOPSY:  - Chronic gastritis with intestinal metaplasia.  No dysplasia or malignancy.  - Scattered organisms suggestive of Helicobacter pylori with Warthin-Starry stain.  B. AMPULLA, AMPULLECTOMY:  - Adenoma.  No high-grade dysplasia or malignancy.  See comment.    Assessment  Craig Hopkins is a 61 y.o. 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 pylori gastritis (has not begun treatment).  The GI service is consulted for evaluation and management of abnormal LFTs, jaundice, recent pancreatitis (a week after endoscopic resection).  The patient is hemodynamically stable.  Clinically is well, he seems to be doing better than he did over the course of the last few days.  I think we have 2 questions at this time, 1 what was the etiology of the patient's pancreatitis clinically this last weekend and second question is what is the etiology of the patient's abnormal LFTs/generalized hepatitis.  1 could theorized that the patient had blood post procedurally then led to a blockage of his biliary stent that subsequently led to the LFTs becoming abnormal or the patient could have a biliary stent dysfunction or he could have progressive biliary issues as a result of progressive pancreatitis.  What interesting is that imaging wise he had no evidence of overt pancreatitis or inflammation as of 2-1/2 days ago.  In either case, I think there is a high chance that we will need to do endoscopic reevaluation of his biliary tree as result of him having a bowel stent in place.  Unless the patient has an acute hepatitis of another  etiology, I think this is the next step in his evaluation.  We will proceed with an abdominal ultrasound liver Doppler in an effort of ensuring that portal flow is good and there is no evidence of Budd-Chiari syndrome and to see if the bile duct has dilated at all over the course the last few days in case we caught him very early during his recent CT scan.  We will see how his liver tests look today and repeat them tomorrow.  He will be n.p.o. after 6 AM tomorrow morning in an effort that whether an ERCP needs to be performed on Thursday or potentially on Friday that he could be available for that (the schedule is quite full for Thursday).  If the patient's LFT pattern significantly improves or is found to have another reason for acute hepatitis, then it may be possible to hold on biliary stent exchange/evaluation.  Time will tell.  The risks of an ERCP were discussed at length, including but not limited to the risk of perforation, bleeding, abdominal pain, post-ERCP pancreatitis (while usually mild can be severe and even life threatening).  The risks and benefits of endoscopic evaluation were discussed with the patient; these include but are not limited to the risk of perforation, infection, bleeding, missed lesions, lack of diagnosis, severe illness requiring hospitalization, as well as anesthesia and sedation related illnesses.  The patient and/or family is agreeable to proceed if it is felt that an ERCP needs to be performed.  All patient questions were answered to the best of my ability, and the patient agrees to the aforementioned plan of action with follow-up as indicated.   Plan/Recommendations  Appreciate medical service admitting  the patient Hold Plavix tonight and tomorrow CMP/ESR/CRP/amylase/lipase at minimum to be obtained Obtain an acute hepatitis panel Liver Doppler ultrasound to be performed Electrolyte repletion as per medical service I am okay for the patient to have a heart healthy diet  up until 6 AM tomorrow and then he will be n.p.o. in case potential ERCP is performed on Thursday IV fluids for maintenance as per medical service If LFT pattern is significantly improved, will consider monitoring Patient will need H. pylori treatment at some point in the future which will be guided by Dr. Henrene Pastor his primary GI   Thank you medicine again for the direct admission of this patient today.  The inpatient GI service will continue to follow.  Please page/call with questions or concerns.   Justice Britain, MD [facility] Office # [phone]

## 2021-12-17 NOTE — Assessment & Plan Note (Signed)
Prior history. 

## 2021-12-17 NOTE — Assessment & Plan Note (Addendum)
Hyponatremia. From poor p.o. intake. Corrected.

## 2021-12-17 NOTE — Telephone Encounter (Signed)
The patient called back this morning. He has been able to stay hydrated and is going to try to take in fluids and potentially a little bit of meal today. He would rather try to come in for a direct hospital admission rather than wait through the ED.  Since he is not feeling lightheaded or dizzy is having less discomfort and is tolerating electrolytes then I think it is reasonable to try to get a bed rather than him way through the emergency department since he is stable.  He has not had any fevers overnight.  Patty, Please reach out to the hospital and set up a direct admission today with the hospital team.  During the day they can reach out to me but we have a plan in place based on my prior notation in the chart.  If the hospitalist team needs to talk with me then they can page me but basically he is going to need repeat cross-sectional imaging (CT versus MRI) and then likely needed repeat ERCP and we cannot do it as an outpatient in the setting of his progressive nature.  FYI inpatient GI team.

## 2021-12-17 NOTE — Assessment & Plan Note (Addendum)
Acute pancreatitis Ampullary adenoma, SP ERCP/EUS/endoscopic embolectomy 12/08/2021 Hyperbilirubinemia Over the weekend patient had increasing abdominal pain as well as jaundice.  His lipase level were elevated.  LFTs were in the 800s.  GI recommended the patient get admitted for further work-up. Currently LFTs trending down. Liver ultrasound Doppler negative for any acute abnormality. GI performed ERCP and his stent was removed. Patient was able to tolerate p.o. diet.  GI recommended patient to be discharged from their perspective with outpatient follow-up.

## 2021-12-17 NOTE — H&P (Signed)
History and Physical    Patient: Craig Hopkins DOB: [birth year] DOA: 12/17/2021 DOS: the patient was seen and examined on 12/17/2021 PCP: Reynold Bowen, MD  Patient coming from: Home  Chief Complaint: Abnormal lab  HPI: Craig Hopkins is a 61 y.o. male with medical history significant of CAD, COPD, GERD, HLD, type I DM with gastroparesis and retinopathy. Patient presented with complaints of abnormal lab. Patient recently underwent EUS ERCP for ampullary adenoma resection on 12/08/2021.  Patient had a positive Cologuard and underwent EGD colonoscopy for further evaluation.  EGD showed presence of an ampullary adenoma for which he was referred for resection. Postprocedure patient had some abdominal discomfort and was placed on some pain medication. H. pylori gastritis was positive patient was recommended to start some medication which she has not started yet. On Monday patient had 1 episode of melena, no further melena or any other GI bleed reported by the patient.  Patient was seen in the clinic on 1/22 for lab work which showed elevated lipase level as well as LFT.  Repeat lab work on 1/24 shows worsening LFTs and bilirubin level. Patient was referred for admission. At the time of my evaluation patient denies any abdominal pain.  No nausea no vomiting no fever.  Patient reports some chills.  No cough no chest pain.  Patient reports constipation ongoing for last 1 week.  Passing gas. Remains compliant with all medication.  Denies any alcohol or Tylenol abuse. Did not take any Percocet that he was prescribed.  Was only taking oxycodone.  Review of Systems: As mentioned in the history of present illness. All other systems reviewed and are negative. Past Medical History:  Diagnosis Date   CAD (coronary artery disease)    COPD (chronic obstructive pulmonary disease) (HCC)    GERD (gastroesophageal reflux disease)    HTN (hypertension)    Hypercholesteremia    Hyperlipidemia     IDDM (insulin dependent diabetes mellitus)    Neuromuscular disorder ([place])    Panic attack    Past Surgical History:  Procedure Laterality Date   BILIARY DILATION  12/08/2021   Procedure: BILIARY DILATION;  Surgeon: Rush Landmark Telford Nab., MD;  Location: [facility];  Service: Gastroenterology;;   BILIARY STENT PLACEMENT N/A 12/08/2021   Procedure: BILIARY STENT PLACEMENT;  Surgeon: Irving Copas., MD;  Location: [facility];  Service: Gastroenterology;  Laterality: N/A;   BIOPSY  12/08/2021   Procedure: BIOPSY;  Surgeon: Irving Copas., MD;  Location: [facility];  Service: Gastroenterology;;   CAROTID STENT  11/23/2006   COLONOSCOPY     ENDOSCOPIC MUCOSAL RESECTION  12/08/2021   Procedure: ENDOSCOPIC MUCOSAL RESECTION;  Surgeon: Rush Landmark Telford Nab., MD;  Location: [facility];  Service: Gastroenterology;;  Ampulectomy   ENDOSCOPIC RETROGRADE CHOLANGIOPANCREATOGRAPHY (ERCP) WITH PROPOFOL N/A 12/08/2021   Procedure: ENDOSCOPIC RETROGRADE CHOLANGIOPANCREATOGRAPHY (ERCP) WITH PROPOFOL;  Surgeon: Irving Copas., MD;  Location: [facility];  Service: Gastroenterology;  Laterality: N/A;   ESOPHAGOGASTRODUODENOSCOPY (EGD) WITH PROPOFOL N/A 12/08/2021   Procedure: ESOPHAGOGASTRODUODENOSCOPY (EGD) WITH PROPOFOL;  Surgeon: Rush Landmark Telford Nab., MD;  Location: [facility];  Service: Gastroenterology;  Laterality: N/A;   EUS N/A 12/08/2021   Procedure: UPPER ENDOSCOPIC ULTRASOUND (EUS) LINEAR;  Surgeon: Irving Copas., MD;  Location: [facility];  Service: Gastroenterology;  Laterality: N/A;   heart catherization  11/23/2009   HEMOSTASIS CONTROL  12/08/2021   Procedure: HEMOSTASIS CONTROL;  Surgeon: Rush Landmark Telford Nab., MD;  Location: [facility];  Service: Gastroenterology;;   left wrist  11/23/1974   SPHINCTEROTOMY  12/08/2021   Procedure: Joan Mayans;  Surgeon: Mansouraty, Telford Nab., MD;  Location: [facility];  Service: Gastroenterology;;   Biliary   SPHINCTEROTOMY  12/08/2021   Procedure: Joan Mayans;  Surgeon: Mansouraty, Telford Nab., MD;  Location: [facility];  Service: Gastroenterology;;  Pancreatic   SUBMUCOSAL LIFTING INJECTION  12/08/2021   Procedure: SUBMUCOSAL LIFTING INJECTION;  Surgeon: Irving Copas., MD;  Location: [facility];  Service: Gastroenterology;;   throat polyp removed  11/23/1994   TONSILLECTOMY  11/23/1998   UPPER GASTROINTESTINAL ENDOSCOPY     Social History:  reports that he has quit smoking. He has quit using smokeless tobacco. He reports current alcohol use of about 1.0 standard drink per week. He reports current drug use. Drug: Marijuana.  No Known Allergies  Family History  Problem Relation Age of Onset   Breast cancer Mother 7   Coronary artery disease Father    Stroke Father    Cancer Other        family history    Coronary artery disease Other        family history    Esophageal cancer Neg Hx    Colon cancer Neg Hx    Rectal cancer Neg Hx    Stomach cancer Neg Hx    Inflammatory bowel disease Neg Hx    Liver disease Neg Hx    Pancreatic cancer Neg Hx     Prior to Admission medications   Medication Sig Start Date End Date Taking? Authorizing Provider  ALPRAZolam Duanne Moron) 0.25 MG tablet Take 0.25 mg by mouth at bedtime as needed for anxiety or sleep.  12/24/11   [provider]  amLODipine-benazepril (LOTREL) 5-10 MG capsule Take 1 capsule by mouth daily. 12/17/20   [provider]  amoxicillin-clarithromycin-lansoprazole [facility]) combo pack Take by mouth 2 (two) times daily. Follow package directions. 12/15/21   Irene Shipper, MD  aspirin EC 81 MG tablet Take 1 tablet (81 mg total) by mouth daily. 01/09/16   Nahser, Wonda Cheng, MD  atorvastatin (LIPITOR) 40 MG tablet Take 40 mg by mouth daily. 12/08/19   [provider]  Carboxymethylcellul-Glycerin (CLEAR EYES FOR DRY EYES) 1-0.25 % SOLN Place 1 drop into both eyes daily as needed (dry eyes).     [provider]  clopidogrel (PLAVIX) 75 MG tablet Take 1 tablet (75 mg total) by mouth daily. 12/11/21   Mansouraty, Telford Nab., MD  Continuous Blood Gluc Sensor (FREESTYLE LIBRE 2 SENSOR) MISC USE TO CHECK GLUCOSE DAILY - REPLACE SENSOR EVERY 14 DAYS. 08/02/21   [provider]  hydrochlorothiazide (HYDRODIURIL) 25 MG tablet Take 25 mg by mouth daily.    [provider]  insulin aspart (NOVOLOG) 100 UNIT/ML injection Inject into the skin See admin instructions. Via Insulin Pump    [provider]  Insulin Human (INSULIN PUMP) SOLN Inject into the skin.    [provider]  Melatonin 5 MG CHEW Chew 5 mg by mouth at bedtime as needed (sleep).    [provider]  omeprazole (PRILOSEC) 20 MG capsule Take 1 capsule (20 mg total) by mouth daily. 12/08/21   Mansouraty, Telford Nab., MD  oxyCODONE-acetaminophen (PERCOCET/ROXICET) 5-325 MG tablet Take 1 tablet by mouth every 6 (six) hours as needed for severe pain. 12/14/21   Smoot, Leary Roca, PA-C  sucralfate (CARAFATE) 1 g tablet Take 1 tablet (1 g total) by mouth 2 (two) times daily. 12/08/21   Mansouraty, Telford Nab., MD  tadalafil (CIALIS) 5 MG  tablet Take 5 mg by mouth daily as needed for erectile dysfunction. 02/10/20   [provider]    Physical Exam: Vitals:   12/17/21 1623  BP: (!) 157/92  Pulse: 95  Resp: 16  Temp: 98.1 F (36.7 C)  TempSrc: Oral  SpO2: 99%   General: Appear in mild distress, no Rash; Oral Mucosa Clear, moist. no Abnormal Neck Mass Or lumps, Conjunctiva normal  Cardiovascular: S1 and S2 Present, no Murmur, Respiratory: good respiratory effort, Bilateral Air entry present and CTA, no Crackles, no wheezes Abdomen: Bowel Sound present, Soft and no tenderness Extremities: no Pedal edema Neurology: alert and oriented to time, place, and person affect appropriate. no new focal deficit Gait not checked due to patient safety concerns   Data Reviewed:  I have  Reviewed nursing notes, Vitals, and Lab results since pt's last encounter. Pertinent lab results CMP shows hyponatremia sodium 132, hypokalemia, elevated but improving LFTs with AST and ALT 420 and 714 respectively, total bilirubin improving from 4.5-2.1, CBC shows thrombocytosis, lactic acid level normal, INR normal. I have ordered test including CBC and CMP.  COVID-19.  Ammonia. I have ordered imaging studies ultrasound liver Doppler. I have reviewed the last note from gastroenterology Dr. Rush Landmark,  and discussed pt's care plan and test results with them.   Assessment/Plan * LFT elevation- (present on admission) Acute pancreatitis Ampullary adenoma, SP ERCP/EUS/endoscopic embolectomy 12/08/2021 Hyperbilirubinemia Over the weekend patient had increasing abdominal pain as well as jaundice.  His lipase level were elevated.  LFTs were in the 800s.  GI recommended the patient get admitted for further work-up. Currently LFTs trending down. GI recommends to perform ultrasound Doppler which we will order.  Hepatitis panel pending.  Check ammonia level as well.  Alcohol Tylenol level pending as well. N.p.o. after midnight in case the patient requires ERCP.  Hypokalemia- (present on admission) Hyponatremia. From poor p.o. intake. Will correct with IV fluid.  Monitor.   Uncontrolled type 1 diabetes mellitus with hyperglycemia, with long-term current use of insulin with Diabetic retinopathy, gastroparesis- (present on admission) Patient is on insulin pump. Follows up with Dr. Forde Dandy. Does not want to come off of the insulin pump and understand the risk for hypoglycemia. Has been managing his n.p.o. status for ERCP and of the procedure outpatient. Continue pump for now. CBG every 4 hours.  Hypertension- (present on admission) On amlodipine Lotrel. Will continue amlodipine for now.   Hyperlipidemia- (present on admission) On Lipitor.  We will hold in the setting of elevated LFT.  Coronary  artery disease- (present on admission) On aspirin and Plavix. Currently holding both.  GERD (gastroesophageal reflux disease)- (present on admission) Continue PPI.  Gastroparesis- (present on admission) Prior history.  Obesity, BMI >30- (present on admission) Placing the patient has a poor outcome.  BMI 32 in September.    Advance Care Planning:   Code Status: Full Code   Consults: Gastroenterology [place]  Family Communication: Family at bedside  Severity of Illness: The appropriate patient status for this patient is INPATIENT. Inpatient status is judged to be reasonable and necessary in order to provide the required intensity of service to ensure the patient's safety. The patient's presenting symptoms, physical exam findings, and initial radiographic and laboratory data in the context of their chronic comorbidities is felt to place them at high risk for further clinical deterioration. Furthermore, it is not anticipated that the patient will be medically stable for discharge from the hospital within 2 midnights of admission.   * I  certify that at the point of admission it is my clinical judgment that the patient will require inpatient hospital care spanning beyond 2 midnights from the point of admission due to high intensity of service, high risk for further deterioration and high frequency of surveillance required.*  Author: Berle Mull, MD 12/17/2021 7:19 PM  For on call review [email or web address].

## 2021-12-17 NOTE — Telephone Encounter (Signed)
Spoke with bed placement and a bed will be reserved for the pt. There are no beds available and per bed placement it will be "a while" they are waiting on discharges.  They will call the pt once a bed is available.  The pt will come in this afternoon for appt as planned.

## 2021-12-17 NOTE — Assessment & Plan Note (Signed)
Continue PPI ?

## 2021-12-17 NOTE — Assessment & Plan Note (Signed)
On Lipitor.  We will hold in the setting of elevated LFT.

## 2021-12-18 ENCOUNTER — Inpatient Hospital Stay (HOSPITAL_COMMUNITY): Payer: BC Managed Care – PPO

## 2021-12-18 ENCOUNTER — Encounter (HOSPITAL_COMMUNITY)
Admission: AD | Disposition: A | Discharge status: Home / Self Care | Payer: Self-pay | Source: Ambulatory Visit | Attending: Internal Medicine

## 2021-12-18 ENCOUNTER — Inpatient Hospital Stay (HOSPITAL_COMMUNITY): Payer: BC Managed Care – PPO | Admitting: Certified Registered"

## 2021-12-18 ENCOUNTER — Other Ambulatory Visit: Payer: Self-pay

## 2021-12-18 ENCOUNTER — Encounter (HOSPITAL_COMMUNITY): Payer: Self-pay | Admitting: Internal Medicine

## 2021-12-18 DIAGNOSIS — Z4659 Encounter for fitting and adjustment of other gastrointestinal appliance and device: Secondary | ICD-10-CM

## 2021-12-18 DIAGNOSIS — R1010 Upper abdominal pain, unspecified: Secondary | ICD-10-CM

## 2021-12-18 DIAGNOSIS — D135 Benign neoplasm of extrahepatic bile ducts: Secondary | ICD-10-CM

## 2021-12-18 DIAGNOSIS — K859 Acute pancreatitis without necrosis or infection, unspecified: Secondary | ICD-10-CM

## 2021-12-18 HISTORY — PX: ERCP: SHX5425

## 2021-12-18 HISTORY — PX: STENT REMOVAL: SHX6421

## 2021-12-18 HISTORY — PX: REMOVAL OF STONES: SHX5545

## 2021-12-18 LAB — BASIC METABOLIC PANEL
Anion gap: 6 (ref 5–15)
BUN: 11 mg/dL (ref 6–20)
CO2: 29 mmol/L (ref 22–32)
Calcium: 8.5 mg/dL — ABNORMAL LOW (ref 8.9–10.3)
Chloride: 101 mmol/L (ref 98–111)
Creatinine, Ser: 1.02 mg/dL (ref 0.61–1.24)
GFR, Estimated: >60 mL/min (ref >60)
Glucose, Bld: 153 mg/dL — ABNORMAL HIGH (ref 70–99)
Potassium: 3.3 mmol/L — ABNORMAL LOW (ref 3.5–5.1)
Sodium: 136 mmol/L (ref 135–145)

## 2021-12-18 LAB — HEPATIC FUNCTION PANEL
ALT: 546 U/L — ABNORMAL HIGH (ref 0–44)
AST: 232 U/L — ABNORMAL HIGH (ref 15–41)
Albumin: 3.4 g/dL — ABNORMAL LOW (ref 3.5–5.0)
Alkaline Phosphatase: 306 U/L — ABNORMAL HIGH (ref 38–126)
Bilirubin, Direct: 0.3 mg/dL — ABNORMAL HIGH (ref 0.0–0.2)
Indirect Bilirubin: 0.9 mg/dL (ref 0.3–0.9)
Total Bilirubin: 1.2 mg/dL (ref 0.3–1.2)
Total Protein: 6.2 g/dL — ABNORMAL LOW (ref 6.5–8.1)

## 2021-12-18 LAB — HEPATITIS PANEL, ACUTE
HCV Ab: NONREACTIVE
Hep A IgM: NONREACTIVE
Hep B C IgM: NONREACTIVE
Hepatitis B Surface Ag: NONREACTIVE

## 2021-12-18 LAB — GLUCOSE, CAPILLARY
Glucose-Capillary: 204 mg/dL — ABNORMAL HIGH (ref 70–99)
Glucose-Capillary: 232 mg/dL — ABNORMAL HIGH (ref 70–99)
Glucose-Capillary: 237 mg/dL — ABNORMAL HIGH (ref 70–99)
Glucose-Capillary: 254 mg/dL — ABNORMAL HIGH (ref 70–99)

## 2021-12-18 SURGERY — ERCP, WITH INTERVENTION IF INDICATED
Anesthesia: General

## 2021-12-18 MED ORDER — SODIUM CHLORIDE 0.9 % IV SOLN: INTRAVENOUS | Status: DC

## 2021-12-18 MED ORDER — DEXAMETHASONE SODIUM PHOSPHATE 10 MG/ML IJ SOLN
INTRAMUSCULAR | Status: DC | PRN
  Administered 2021-12-18: 5 mg via INTRAVENOUS

## 2021-12-18 MED ORDER — FENTANYL CITRATE (PF) 100 MCG/2ML IJ SOLN
INTRAMUSCULAR | Status: AC
  Filled 2021-12-18: qty 2

## 2021-12-18 MED ORDER — PROPOFOL 10 MG/ML IV BOLUS
INTRAVENOUS | Status: AC
  Filled 2021-12-18: qty 20

## 2021-12-18 MED ORDER — ONDANSETRON HCL 4 MG/2ML IJ SOLN
INTRAMUSCULAR | Status: DC | PRN
  Administered 2021-12-18: 4 mg via INTRAVENOUS

## 2021-12-18 MED ORDER — CIPROFLOXACIN IN D5W 400 MG/200ML IV SOLN
INTRAVENOUS | Status: DC | PRN
  Administered 2021-12-18: 400 mg via INTRAVENOUS

## 2021-12-18 MED ORDER — INDOMETHACIN 50 MG RE SUPP
RECTAL | Status: AC
  Filled 2021-12-18: qty 2

## 2021-12-18 MED ORDER — SUGAMMADEX SODIUM 500 MG/5ML IV SOLN
INTRAVENOUS | Status: DC | PRN
  Administered 2021-12-18: 400 mg via INTRAVENOUS

## 2021-12-18 MED ORDER — POTASSIUM CHLORIDE 10 MEQ/100ML IV SOLN
10.0000 meq | INTRAVENOUS | Status: DC
  Administered 2021-12-18: 10 meq via INTRAVENOUS
  Filled 2021-12-18: qty 100

## 2021-12-18 MED ORDER — ROCURONIUM BROMIDE 10 MG/ML (PF) SYRINGE
PREFILLED_SYRINGE | INTRAVENOUS | Status: DC | PRN
  Administered 2021-12-18: 100 mg via INTRAVENOUS

## 2021-12-18 MED ORDER — POTASSIUM CHLORIDE CRYS ER 20 MEQ PO TBCR
40.0000 meq | EXTENDED_RELEASE_TABLET | Freq: Once | ORAL | Status: AC
  Administered 2021-12-18: 40 meq via ORAL
  Filled 2021-12-18: qty 2

## 2021-12-18 MED ORDER — MIDAZOLAM HCL 2 MG/2ML IJ SOLN
INTRAMUSCULAR | Status: AC
  Filled 2021-12-18: qty 2

## 2021-12-18 MED ORDER — CIPROFLOXACIN IN D5W 400 MG/200ML IV SOLN
INTRAVENOUS | Status: AC
  Filled 2021-12-18: qty 200

## 2021-12-18 MED ORDER — FENTANYL CITRATE (PF) 100 MCG/2ML IJ SOLN
INTRAMUSCULAR | Status: DC | PRN
  Administered 2021-12-18 (×2): 50 ug via INTRAVENOUS

## 2021-12-18 MED ORDER — LIDOCAINE 2% (20 MG/ML) 5 ML SYRINGE
INTRAMUSCULAR | Status: DC | PRN
  Administered 2021-12-18: 80 mg via INTRAVENOUS

## 2021-12-18 MED ORDER — PHENYLEPHRINE HCL-NACL 20-0.9 MG/250ML-% IV SOLN
INTRAVENOUS | Status: DC | PRN
  Administered 2021-12-18: 25 ug/min via INTRAVENOUS

## 2021-12-18 MED ORDER — SODIUM CHLORIDE 0.9 % IV SOLN
INTRAVENOUS | Status: DC | PRN
  Administered 2021-12-18: 35 mL

## 2021-12-18 MED ORDER — GLUCAGON HCL RDNA (DIAGNOSTIC) 1 MG IJ SOLR
INTRAMUSCULAR | Status: AC
  Filled 2021-12-18: qty 1

## 2021-12-18 MED ORDER — MIDAZOLAM HCL 2 MG/2ML IJ SOLN
INTRAMUSCULAR | Status: DC | PRN
  Administered 2021-12-18 (×2): 1 mg via INTRAVENOUS

## 2021-12-18 MED ORDER — INDOMETHACIN 50 MG RE SUPP
RECTAL | Status: DC | PRN
  Administered 2021-12-18: 100 mg via RECTAL

## 2021-12-18 MED ORDER — PHENYLEPHRINE HCL (PRESSORS) 10 MG/ML IV SOLN
INTRAVENOUS | Status: AC
  Filled 2021-12-18: qty 1

## 2021-12-18 MED ORDER — PROPOFOL 10 MG/ML IV BOLUS
INTRAVENOUS | Status: DC | PRN
  Administered 2021-12-18: 150 mg via INTRAVENOUS
  Administered 2021-12-18: 50 mg via INTRAVENOUS

## 2021-12-18 NOTE — Transfer of Care (Signed)
Immediate Anesthesia Transfer of Care Note  Patient: Craig Hopkins  Procedure(s) Performed: ENDOSCOPIC RETROGRADE CHOLANGIOPANCREATOGRAPHY (ERCP) STENT REMOVAL BALLOON SWEEP FOR STONES  Patient Location: PACU and Endoscopy Unit  Anesthesia Type:General  Level of Consciousness: awake and alert   Airway & Oxygen Therapy: Patient Spontanous Breathing and Patient connected to face mask oxygen  Post-op Assessment: Report given to RN and Post -op Vital signs reviewed and stable  Post vital signs: Reviewed  Last Vitals:  Vitals Value Taken Time  BP 135/84 12/18/21 1100  Temp    Pulse 95 12/18/21 1101  Resp 18 12/18/21 1101  SpO2 97 % 12/18/21 1101  Vitals shown include unvalidated device data.  Last Pain:  Vitals:   12/18/21 0903  TempSrc: Tympanic  PainSc: 0-No pain      Patients Stated Pain Goal: 0 (20/60/15 6153)  Complications: No notable events documented.

## 2021-12-18 NOTE — Anesthesia Postprocedure Evaluation (Signed)
Anesthesia Post Note  Patient: KOVEN BELINSKY  Procedure(s) Performed: ENDOSCOPIC RETROGRADE CHOLANGIOPANCREATOGRAPHY (ERCP) STENT REMOVAL BALLOON SWEEP FOR STONES     Patient location during evaluation: PACU Anesthesia Type: General Level of consciousness: awake and alert Pain management: pain level controlled Vital Signs Assessment: post-procedure vital signs reviewed and stable Respiratory status: spontaneous breathing, nonlabored ventilation, respiratory function stable and patient connected to nasal cannula oxygen Cardiovascular status: blood pressure returned to baseline and stable Postop Assessment: no apparent nausea or vomiting Anesthetic complications: no   No notable events documented.  Last Vitals:  Vitals:   12/18/21 1112 12/18/21 1120  BP: 131/76 125/77  Pulse: 79 76  Resp: 16 14  Temp:    SpO2: 91% 98%    Last Pain:  Vitals:   12/18/21 1120  TempSrc:   PainSc: 0-No pain                 Barrett Holthaus L Ghislaine Harcum

## 2021-12-18 NOTE — Op Note (Addendum)
[facility] Patient Name: Craig Hopkins Procedure Date: 12/18/2021 MRN: [id number] Attending MD: Milus Banister , MD Date of Birth: 08/21/1961 CSN: [id number] Age: 61 Admit Type: Inpatient Procedure:                ERCP Indications:              Biliary stent removal Providers:                Milus Banister, MD, Carmie End, RN, Cletis Athens, Technician, Glenis Smoker, CRNA Referring MD:              Medicines:                General Anesthesia, Cipro 400 mg IV, Indomethacin                            878 mg PR Complications:            No immediate complications. Estimated blood loss:                            None Estimated Blood Loss:     Estimated blood loss: none. Procedure:                Pre-Anesthesia Assessment:                           - Prior to the procedure, a History and Physical                            was performed, and patient medications and                            allergies were reviewed. The patient's tolerance of                            previous anesthesia was also reviewed. The risks                            and benefits of the procedure and the sedation                            options and risks were discussed with the patient.                            All questions were answered, and informed consent                            was obtained. Prior Anticoagulants: The patient has                            taken Plavix (clopidogrel), last dose was 3 days  prior to procedure. ASA Grade Assessment: III - A                            patient with severe systemic disease. After                            reviewing the risks and benefits, the patient was                            deemed in satisfactory condition to undergo the                            procedure.                           After obtaining informed consent, the scope was                            passed  under direct vision. Throughout the                            procedure, the patient's blood pressure, pulse, and                            oxygen saturations were monitored continuously. The                            TJF-Q190V ([id number]) Olympus duodenoscope was                            introduced through the mouth, and used to inject                            contrast into and used to inject contrast into the                            bile duct. The ERCP was accomplished without                            difficulty. The patient tolerated the procedure                            well. Scope In: Scope Out: Findings:      A biliary stent was visible on the scout film. The esophagus was       successfully intubated under direct vision. The scope was advanced to a       normal major papilla in the descending duodenum without detailed       examination of the pharynx, larynx and associated structures, and upper       GI tract. The upper GI tract was grossly normal. The previously placed       plastic biliary stent was noted in decent position with the most distal       3-3mm extending into the duodenum. There was evidence of recent       sphincterotomies  but no ulcers, no obvious remaining polypoid tissue. I       attempted removal of the stent with a rat tooth forceps and was able to       remove it about 1/2 way, and then I used a snare to remove it the rest       of the way, out of the patient. I used a 36 atutotome of a .035 jagwire       to cannulate the bile duct and injected contrast. Cholangiogram showed a       non-dilated extrahepatic biliary tree without obvious filling defects.       The cytic duct and gallbladder filled with contrast. I swept the bile       duct several times with a retrieval balloon inflated to 28mm. A miniscule       amount of biodebris was swept into the duodenum one time. There was no       purulence, sludge or blood. A completion, occlusion  cholangiogram was       normal. The panreatic duct was never cannulated with the wire or       injected with dye. Impression:               - The previously placed biliary stent was in decent                            position, perhaps slightly more proximally located                            than is considered ideal (3-76mm of the distal end                            of the stent was extending into the duodenum). see                            images                           - No ulcers from the recent ampulectomy.                           - I removed the previous biliary stent, swept the                            duct several times with a retrieval balloon without                            delivery of discrete stones, sludge, blood clots or                            purlence. The contrast drained nicely at the end of                            the exam and so I did not place another biliary                            stent.                           -  He is OK for discharge home today.                           - [facility] office will coordinate repeat LFTs                            next week.                           - I discussed this with him and his GF.                           - He can resume his bloodthinner plavix tomorrow. Moderate Sedation:      Not Applicable - Patient had care per Anesthesia. Recommendation:           - see above Procedure Code(s):        --- Professional ---                           [phone], Endoscopic retrograde                            cholangiopancreatography (ERCP); with removal of                            foreign body(s) or stent(s) from biliary/pancreatic                            duct(s) Diagnosis Code(s):        --- Professional ---                           Y48.25, Encounter for fitting and adjustment of                            other gastrointestinal appliance and device CPT copyright 2019 [employer]. All  rights reserved. The codes documented in this report are preliminary and upon coder review may  be revised to meet current compliance requirements. Milus Banister, MD 12/18/2021 10:51:20 AM This report has been signed electronically. Number of Addenda: 0

## 2021-12-18 NOTE — Anesthesia Preprocedure Evaluation (Addendum)
Anesthesia Evaluation  Patient identified by MRN, date of birth, ID band Patient awake    Reviewed: Allergy & Precautions, NPO status , Patient's Chart, lab work & pertinent test results  Airway Mallampati: I  TM Distance: >3 FB Neck ROM: Full    Dental  (+) Chipped, Dental Advisory Given,    Pulmonary COPD, former smoker,    Pulmonary exam normal breath sounds clear to auscultation       Cardiovascular hypertension, Pt. on medications + CAD  Normal cardiovascular exam Rhythm:Regular Rate:Normal     Neuro/Psych PSYCHIATRIC DISORDERS Anxiety negative neurological ROS     GI/Hepatic Neg liver ROS, GERD  ,  Endo/Other  diabetes, Type 2, Insulin Dependent  Renal/GU negative Renal ROS  negative genitourinary   Musculoskeletal  (+) Arthritis ,   Abdominal   Peds  Hematology  (+) Blood dyscrasia (on plavix), ,   Anesthesia Other Findings Abnormal LFTs, biliary stent dysfunction, recent pancreatitis, recent ampullectomy  Reproductive/Obstetrics                            Anesthesia Physical Anesthesia Plan  ASA: 3  Anesthesia Plan: General   Post-op Pain Management:    Induction: Intravenous  PONV Risk Score and Plan: 2 and Midazolam, Dexamethasone and Ondansetron  Airway Management Planned: Oral ETT  Additional Equipment:   Intra-op Plan:   Post-operative Plan: Extubation in OR  Informed Consent: I have reviewed the patients History and Physical, chart, labs and discussed the procedure including the risks, benefits and alternatives for the proposed anesthesia with the patient or authorized representative who has indicated his/her understanding and acceptance.     Dental advisory given  Plan Discussed with: CRNA  Anesthesia Plan Comments:         Anesthesia Quick Evaluation

## 2021-12-18 NOTE — Interval H&P Note (Signed)
History and Physical Interval Note:  12/18/2021 9:08 AM  Craig Hopkins  has presented today for surgery, with the diagnosis of Abnormal LFTs, biliary stent dysfunction, recent pancreatitis, recent ampullectomy.  The various methods of treatment have been discussed with the patient and family. After consideration of risks, benefits and other options for treatment, the patient has consented to  Procedure(s): ENDOSCOPIC RETROGRADE CHOLANGIOPANCREATOGRAPHY (ERCP) (N/A) as a surgical intervention.  The patient's history has been reviewed, patient examined, no change in status, stable for surgery.  I have reviewed the patient's chart and labs.  Questions were answered to the patient's satisfaction.     Milus Banister

## 2021-12-18 NOTE — Anesthesia Procedure Notes (Signed)
Procedure Name: Intubation Date/Time: 12/18/2021 9:48 AM Performed by: Cynda Familia, CRNA Pre-anesthesia Checklist: Patient identified, Emergency Drugs available, Suction available and Patient being monitored Patient Re-evaluated:Patient Re-evaluated prior to induction Oxygen Delivery Method: Circle System Utilized Preoxygenation: Pre-oxygenation with 100% oxygen Induction Type: IV induction Ventilation: Mask ventilation without difficulty Laryngoscope Size: Miller and 2 Grade View: Grade II Tube type: Oral Tube size: 7.5 mm Number of attempts: 1 Airway Equipment and Method: Stylet Placement Confirmation: ETT inserted through vocal cords under direct vision, positive ETCO2 and breath sounds checked- equal and bilateral Tube secured with: Tape Dental Injury: Teeth and Oropharynx as per pre-operative assessment  Difficulty Due To: Difficult Airway- due to anterior larynx and Difficult Airway- due to large tongue Future Recommendations: Recommend- induction with short-acting agent, and alternative techniques readily available Comments: IV induction Woodrum--intubation AM CRNA atraumatic-- teeth and mouth as preop -- bilat BS Woodrum-- pt reports broken teeth lateral/back on both sides- unchanged

## 2021-12-18 NOTE — TOC Transition Note (Signed)
Transition of Care [facility]) - CM/SW Discharge Note   Patient Details  Name: Craig Hopkins MRN: [id number] Date of Birth: 04/06/1961  Transition of Care [facility]) CM/SW Contact:  Leeroy Cha, RN Phone Number: 12/18/2021, 1:33 PM   Clinical Narrative:     Transition of Care [facility]) Screening Note   Patient Details  Name: Craig Hopkins Date of Birth: [birth year]   Transition of Care [facility]) CM/SW Contact:    Leeroy Cha, RN Phone Number: 12/18/2021, 1:33 PM    Transition of Care Department [facility]) has reviewed patient and no TOC needs have been identified at this time. We will continue to monitor patient advancement through interdisciplinary progression rounds. If new patient transition needs arise, please place a TOC consult.     Final next level of care: Home/Self Care Barriers to Discharge: No Barriers Identified   Patient Goals and CMS Choice Patient states their goals for this hospitalization and ongoing recovery are:: to go home CMS Medicare.gov Compare Post Acute Care list provided to:: Patient    Discharge Placement                       Discharge Plan and Services   Discharge Planning Services: CM Consult                                 Social Determinants of Health (SDOH) Interventions     Readmission Risk Interventions No flowsheet data found.

## 2021-12-18 NOTE — Anesthesia Procedure Notes (Signed)
Date/Time: 12/18/2021 10:49 AM Performed by: Cynda Familia, CRNA Oxygen Delivery Method: Simple face mask Placement Confirmation: positive ETCO2 and breath sounds checked- equal and bilateral Dental Injury: Teeth and Oropharynx as per pre-operative assessment

## 2021-12-19 ENCOUNTER — Other Ambulatory Visit: Payer: Self-pay

## 2021-12-19 ENCOUNTER — Encounter (HOSPITAL_COMMUNITY): Payer: Self-pay | Admitting: Gastroenterology

## 2021-12-19 MED ORDER — PANTOPRAZOLE SODIUM 40 MG PO TBEC: 40.0000 mg | DELAYED_RELEASE_TABLET | Freq: Two times a day (BID) | ORAL | 0 refills | Status: DC

## 2021-12-19 MED ORDER — BISMUTH 262 MG PO CHEW: 524.0000 mg | CHEWABLE_TABLET | Freq: Four times a day (QID) | ORAL | 0 refills | Status: DC

## 2021-12-19 MED ORDER — AMOXICILLIN 500 MG PO CAPS: 500.0000 mg | ORAL_CAPSULE | Freq: Four times a day (QID) | ORAL | 0 refills | Status: DC

## 2021-12-19 MED ORDER — METRONIDAZOLE 250 MG PO TABS: 250.0000 mg | ORAL_TABLET | Freq: Four times a day (QID) | ORAL | 0 refills | Status: DC

## 2021-12-19 NOTE — Telephone Encounter (Signed)
Vaughan Basta it looks like the H pylori is being treated by Dr Henrene Pastor.  I see that prevpac was sent on 1/23 not sure if that was before or after the pt was at the pharmacy.

## 2021-12-22 NOTE — Discharge Summary (Signed)
Physician Discharge Summary   Patient: Craig Hopkins MRN: [id number] DOB: [birth year]  Admit date:     12/17/2021  Discharge date: 12/18/2021  Discharge Physician: Berle Mull   PCP: Reynold Bowen, MD   Recommendations at discharge:   Follow-up with GI as recommended.  Discharge Diagnoses Principal Problem:   LFT elevation Active Problems:   Ampullary adenoma   Acute pancreatitis   Hyperbilirubinemia   Hypokalemia   Hyponatremia   Uncontrolled type 1 diabetes mellitus with hyperglycemia, with long-term current use of insulin with Diabetic retinopathy, gastroparesis   Hypertension   Hyperlipidemia   Coronary artery disease   GERD (gastroesophageal reflux disease)   Obesity, BMI >30   Gastroparesis  Resolved Problems:   * No resolved hospital problems. [facility] Course    * LFT elevation- (present on admission) Acute pancreatitis Ampullary adenoma, SP ERCP/EUS/endoscopic embolectomy 12/08/2021 Hyperbilirubinemia Over the weekend patient had increasing abdominal pain as well as jaundice.  His lipase level were elevated.  LFTs were in the 800s.  GI recommended the patient get admitted for further work-up. Currently LFTs trending down. Liver ultrasound Doppler negative for any acute abnormality. GI performed ERCP and his stent was removed. Patient was able to tolerate p.o. diet.  GI recommended patient to be discharged from their perspective with outpatient follow-up.  Hypokalemia- (present on admission) Hyponatremia. From poor p.o. intake. Corrected.  Uncontrolled type 1 diabetes mellitus with hyperglycemia, with long-term current use of insulin with Diabetic retinopathy, gastroparesis- (present on admission) Patient is on insulin pump. Follows up with Dr. Forde Dandy. Does not want to come off of the insulin pump and understand the risk for hypoglycemia. Has been managing his n.p.o. status for ERCP and all other procedure outpatient. Continue pump for  now.  Hypertension- (present on admission) On amlodipine Lotrel. Will continue amlodipine for now.   Hyperlipidemia- (present on admission) On Lipitor.  We will hold in the setting of elevated LFT.  Coronary artery disease- (present on admission) On aspirin and Plavix.  Resume per GI.  GERD (gastroesophageal reflux disease)- (present on admission) Continue PPI.  Gastroparesis- (present on admission) Prior history.  Obesity, BMI >30- (present on admission) Placing the patient has a poor outcome.   BMI 32 in September.      Consultants: Gastroenterology Procedures performed: ERCP Disposition: Home Diet recommendation: Cardiac diet  DISCHARGE MEDICATION: Allergies as of 12/18/2021   No Known Allergies      Medication List     TAKE these medications    ALPRAZolam 0.25 MG tablet Commonly known as: XANAX Take 0.25 mg by mouth at bedtime as needed for anxiety or sleep.   amLODipine-benazepril 5-10 MG capsule Commonly known as: LOTREL Take 1 capsule by mouth daily.   amoxicillin-clarithromycin-lansoprazole combo pack Commonly known as: PREVPAC Take by mouth 2 (two) times daily. Follow package directions.   aspirin EC 81 MG tablet Take 1 tablet (81 mg total) by mouth daily.   atorvastatin 40 MG tablet Commonly known as: LIPITOR Take 40 mg by mouth daily.   buPROPion 150 MG 24 hr tablet Commonly known as: WELLBUTRIN XL Take 150 mg by mouth every morning.   Clear Eyes for Dry Eyes 1-0.25 % Soln Generic drug: Carboxymethylcellul-Glycerin Place 1 drop into both eyes daily as needed (dry eyes).   clopidogrel 75 MG tablet Commonly known as: PLAVIX Take 1 tablet (75 mg total) by mouth daily.   FreeStyle Libre 2 Sensor Misc USE TO CHECK GLUCOSE DAILY - REPLACE SENSOR EVERY 14 DAYS.  hydrochlorothiazide 12.5 MG capsule Commonly known as: MICROZIDE Take 12.5 mg by mouth daily.   insulin aspart 100 UNIT/ML injection Commonly known as: novoLOG Inject into  the skin See admin instructions. Via Insulin Pump   insulin pump Soln Inject into the skin.   Melatonin 5 MG Chew Chew 5 mg by mouth at bedtime as needed (sleep).   omeprazole 20 MG capsule Commonly known as: PRILOSEC Take 1 capsule (20 mg total) by mouth daily.   oxyCODONE-acetaminophen 5-325 MG tablet Commonly known as: PERCOCET/ROXICET Take 1 tablet by mouth every 6 (six) hours as needed for severe pain.   sucralfate 1 g tablet Commonly known as: CARAFATE Take 1 tablet (1 g total) by mouth 2 (two) times daily.   tadalafil 5 MG tablet Commonly known as: CIALIS Take 5 mg by mouth daily as needed for erectile dysfunction.         Discharge Exam: Filed Weights   12/18/21 0903  Weight: 99.8 kg   Vitals:   12/18/21 1101 12/18/21 1112 12/18/21 1120 12/18/21 1313  BP: 135/84 131/76 125/77 140/86  Pulse: 91 79 76 95  Resp: (!) 29 16 14 14   Temp: 97.8 F (36.6 C)   98.7 F (37.1 C)  TempSrc: Oral   Oral  SpO2: 93% 91% 98% 92%  Weight:      Height:        General: Appear in mild distress, no Rash; Oral Mucosa Clear, moist. no Abnormal Neck Mass Or lumps, Conjunctiva normal  Cardiovascular: S1 and S2 Present, no Murmur, Respiratory: good respiratory effort, Bilateral Air entry present and CTA, no Crackles, no wheezes Abdomen: Bowel Sound present, Soft and no tenderness Extremities: no Pedal edema Neurology: alert and oriented to time, place, and person affect appropriate. no new focal deficit Gait not checked due to patient safety concerns   Condition at discharge: good  The results of significant diagnostics from this hospitalization (including imaging, microbiology, ancillary and laboratory) are listed below for reference.   Imaging Studies: CT ABDOMEN PELVIS W CONTRAST  Result Date: 12/14/2021 CLINICAL DATA:  Epigastric pain.  Status post recent ERCP EXAM: CT ABDOMEN AND PELVIS WITH CONTRAST TECHNIQUE: Multidetector CT imaging of the abdomen and pelvis was  performed using the standard protocol following bolus administration of intravenous contrast. RADIATION DOSE REDUCTION: This exam was performed according to the departmental dose-optimization program which includes automated exposure control, adjustment of the mA and/or kV according to patient size and/or use of iterative reconstruction technique. CONTRAST:  141mL OMNIPAQUE IOHEXOL 300 MG/ML  SOLN COMPARISON:  None. FINDINGS: Lower chest: No acute abnormality. Hepatobiliary: Liver is normal in size and contour with no suspicious mass identified. Gallbladder appears normal. No biliary ductal dilatation identified. Common bile duct stent identified. Pancreas: Unremarkable. No pancreatic ductal dilatation or surrounding inflammatory changes. Spleen: Normal in size without focal abnormality. Adrenals/Urinary Tract: Adrenal glands are unremarkable. Kidneys are normal, without renal calculi, focal lesion, or hydronephrosis. Bladder is unremarkable. Stomach/Bowel: Stomach is within normal limits. Appendix appears normal. No evidence of bowel wall thickening, distention, or inflammatory changes. Moderate amount of retained fecal material throughout the colon. Vascular/Lymphatic: Aortic atherosclerosis. No enlarged abdominal or pelvic lymph nodes. Reproductive: Prostate gland is enlarged with indentation on the base of the urinary bladder. Other: No ascites. Musculoskeletal: No acute or significant osseous findings. IMPRESSION: 1. No acute process identified. 2. Common bile duct stent in place.  No biliary ductal dilatation. 3. Prostatomegaly. Electronically Signed   By: Ofilia Neas M.D.   On:  12/14/2021 15:46   DG CHEST PORT 1 VIEW  Result Date: 12/08/2021 CLINICAL DATA:  Abdominal discomfort after ERCP. EXAM: PORTABLE CHEST 1 VIEW COMPARISON:  Chest x-ray dated May 16, 2004. FINDINGS: The heart size and mediastinal contours are within normal limits. Both lungs are clear. The visualized skeletal structures are  unremarkable. IMPRESSION: No active disease. Electronically Signed   By: Titus Dubin M.D.   On: 12/08/2021 14:07   DG ERCP  Result Date: 12/18/2021 CLINICAL DATA:  ERCP EXAM: ERCP TECHNIQUE: Multiple spot images obtained with the fluoroscopic device and submitted for interpretation post-procedure. FLUOROSCOPY TIME:  Refer to separate procedure report COMPARISON:  None. FINDINGS: A single fluoroscopic cine loop and 2 fluoroscopic spot images are submitted for review taken during ERCP. Fluoroscopic loop demonstrates wire catheterization of the common bile duct. There is faint contrast opacification of common bile duct after injection. Subsequent images demonstrate opacification of the intrahepatic and extrahepatic biliary tree. The intrahepatic bile ducts appear normal in caliber. There is contrast through the cystic duct and in the gallbladder. Balloon sweeps of the common bile duct appear to be performed. IMPRESSION: Fluoroscopic images obtained during ERCP as described. Refer to separate procedure report for full details. These images were submitted for radiologic interpretation only. Please see the procedural report for the amount of contrast and the fluoroscopy time utilized. Electronically Signed   By: Albin Felling M.D.   On: 12/18/2021 11:26   DG ERCP WITH SPHINCTEROTOMY  Result Date: 12/08/2021 CLINICAL DATA:  ERCP for ampullary adenoma resection. EXAM: ERCP TECHNIQUE: Multiple spot images obtained with the fluoroscopic device and submitted for interpretation post-procedure. FLUOROSCOPY TIME:  Fluoroscopy Time:  2 minutes, 49 seconds COMPARISON:  MRI 09/12/2021 FINDINGS: Fluoroscopic images demonstrate placement of wire within the biliary system and the main pancreatic duct. There is minimal opacification of the biliary system. Placement of a nonmetallic biliary stent at the end of the procedure. IMPRESSION: Placement of nonmetallic biliary stent. These images were submitted for radiologic  interpretation only. Please see the procedural report for the amount of contrast and the fluoroscopy time utilized. Electronically Signed   By: Markus Daft M.D.   On: 12/08/2021 12:15   DG Abd 2 Views  Result Date: 12/17/2021 CLINICAL DATA:  Abdominal pain, evaluate for pancreatitis. History of biliary stent placement on 12/08/2021. EXAM: ABDOMEN - 2 VIEW COMPARISON:  CT abdomen and pelvis 12/14/2021. FINDINGS: Biliary stent is again noted in the right abdomen, unchanged in position. Bowel-gas pattern is nonobstructive. There is a large amount of stool throughout the colon. There are prominent vascular calcifications in the pelvis, unchanged. Lung bases are clear. No acute fractures are seen. IMPRESSION: 1. Nonobstructive bowel gas pattern. 2. Biliary stent is grossly unchanged in position. Electronically Signed   By: Ronney Asters M.D.   On: 12/17/2021 22:20   DG Abd Portable 2V  Result Date: 12/08/2021 CLINICAL DATA:  Abdominal discomfort after ERCP. EXAM: PORTABLE ABDOMEN - 2 VIEW COMPARISON:  None. FINDINGS: Right upper quadrant biliary stent. The bowel gas pattern is normal. There is no evidence of free air. Mildly increased colonic stool burden. No radio-opaque calculi or other significant radiographic abnormality is seen. IMPRESSION: 1. No acute findings. Electronically Signed   By: Titus Dubin M.D.   On: 12/08/2021 14:06   DG C-Arm 1-60 Min-No Report  Result Date: 12/08/2021 Fluoroscopy was utilized by the requesting physician.  No radiographic interpretation.   US LIVER DOPPLER  Result Date: 12/18/2021 CLINICAL DATA:  Elevated LFTs, biliary stent  EXAM: DUPLEX ULTRASOUND OF LIVER TECHNIQUE: Color and duplex Doppler ultrasound was performed to evaluate the hepatic in-flow and out-flow vessels. COMPARISON:  CT 12/14/2021 and previous FINDINGS: Liver: Normal parenchymal echogenicity. Normal hepatic contour without nodularity. No focal lesion, mass or intrahepatic biliary ductal dilatation.  Main Portal Vein size: 0.9 cm Portal Vein Velocities (all hepatopetal): Main Prox:  16 cm/sec Main Mid: 17 cm/sec Main Dist:  24 cm/sec Right: 10 cm/sec Left: 14 cm/sec Hepatic Vein Velocities (all hepatofugal): Right:  17 cm/sec Middle:  14 cm/sec Left:  8 cm/sec IVC: Present and patent with normal respiratory phasicity. Velocity 17 cm/sec. Hepatic Artery Velocity:  68 cm/sec Splenic Vein Velocity:  18 cm/sec Spleen: 4.2 cm x 9 cm x 4.3 cm with a total volume of 84 cm^3 (411 cm^3 is upper limit normal) Portal Vein Occlusion/Thrombus: No Splenic Vein Occlusion/Thrombus: No Ascites: None Varices: None The gallbladder is incompletely distended, with wall thickening up to 3.5 mm. Echogenic sludge in the lumen, no definite stones. No pericholecystic fluid. IMPRESSION: 1. Unremarkable hepatic vascular Doppler evaluation. 2. Sludge in the nondistended gallbladder. Electronically Signed   By: Lucrezia Europe M.D.   On: 12/18/2021 07:48    Microbiology: Results for orders placed or performed in visit on 09/22/19  Novel Coronavirus, NAA ([facility])     Status: None   Collection Time: 09/22/19  2:40 PM   Specimen: Nasopharyngeal(NP) swabs in vial transport medium   NASOPHARYNGE  TESTING  Result Value Ref Range Status   SARS-CoV-2, NAA Not Detected Not Detected Final    Comment: Testing was performed using the cobas(R) SARS-CoV-2 test. This nucleic acid amplification test was developed and its performance characteristics determined by [employer]. Nucleic acid amplification tests include PCR and TMA. This test has not been FDA cleared or approved. This test has been authorized by FDA under an Emergency Use Authorization (EUA). This test is only authorized for the duration of time the declaration that circumstances exist justifying the authorization of the emergency use of in vitro diagnostic tests for detection of SARS-CoV-2 virus and/or diagnosis of COVID-19 infection under section 564(b)(1) of the Act,  21 U.S.C. 794IAX-6(P) (1), unless the authorization is terminated or revoked sooner. When diagnostic testing is negative, the possibility of a false negative result should be considered in the context of a patient's recent exposures and the presence of clinical signs and symptoms consistent with COVID-19. An individual without symptoms  of COVID-19 and who is not shedding SARS-CoV-2 virus would expect to have a negative (not detected) result in this assay.     Labs: CBC: Recent Labs  Lab 12/16/21 1547 12/17/21 1730  WBC 5.6 5.7  NEUTROABS 3.5 3.1  HGB 13.0 13.8  HCT 38.5* 38.4*  MCV 91.2 88.5  PLT 428.0* 537*   Basic Metabolic Panel: Recent Labs  Lab 12/16/21 1547 12/17/21 1730 12/18/21 0351  NA 132* 132* 136  K 3.3* 3.4* 3.3*  CL 95* 97* 101  CO2 29 25 29   GLUCOSE 190* 226* 153*  BUN 15 10 11   CREATININE 0.99 0.80 1.02  CALCIUM 9.0 8.8* 8.5*  MG  --  1.9  --    Liver Function Tests: Recent Labs  Lab 12/16/21 1547 12/17/21 1730 12/18/21 0351  AST 856* 420* 232*  ALT 832* 714* 546*  ALKPHOS 379* 376* 306*  BILITOT 4.5* 2.1* 1.2  PROT 6.3 7.1 6.2*  ALBUMIN 3.8 3.9 3.4*   CBG: Recent Labs  Lab 12/17/21 2038 12/18/21 0037 12/18/21 0709 12/18/21 0909 12/18/21  [place]    Discharge time spent: greater than 30 minutes.  Signed: Berle Mull, MD [facility]

## 2021-12-26 ENCOUNTER — Other Ambulatory Visit (INDEPENDENT_AMBULATORY_CARE_PROVIDER_SITE_OTHER): Payer: BC Managed Care – PPO

## 2021-12-26 DIAGNOSIS — K859 Acute pancreatitis without necrosis or infection, unspecified: Secondary | ICD-10-CM

## 2021-12-26 DIAGNOSIS — R1010 Upper abdominal pain, unspecified: Secondary | ICD-10-CM

## 2021-12-26 DIAGNOSIS — D135 Benign neoplasm of extrahepatic bile ducts: Secondary | ICD-10-CM | POA: Diagnosis not present

## 2021-12-26 LAB — HEPATIC FUNCTION PANEL
ALT: 64 U/L — ABNORMAL HIGH (ref 0–53)
AST: 29 U/L (ref 0–37)
Albumin: 4 g/dL (ref 3.5–5.2)
Alkaline Phosphatase: 130 U/L — ABNORMAL HIGH (ref 39–117)
Bilirubin, Direct: 0.2 mg/dL (ref 0.0–0.3)
Total Bilirubin: 0.8 mg/dL (ref 0.2–1.2)
Total Protein: 6.6 g/dL (ref 6.0–8.3)

## 2022-01-02 DIAGNOSIS — E10319 Type 1 diabetes mellitus with unspecified diabetic retinopathy without macular edema: Secondary | ICD-10-CM | POA: Diagnosis not present

## 2022-01-06 ENCOUNTER — Other Ambulatory Visit: Payer: Self-pay

## 2022-01-06 DIAGNOSIS — A048 Other specified bacterial intestinal infections: Secondary | ICD-10-CM

## 2022-01-26 ENCOUNTER — Telehealth: Payer: Self-pay

## 2022-01-26 ENCOUNTER — Ambulatory Visit (INDEPENDENT_AMBULATORY_CARE_PROVIDER_SITE_OTHER): Payer: BC Managed Care – PPO | Admitting: Internal Medicine

## 2022-01-26 ENCOUNTER — Encounter: Payer: Self-pay | Admitting: Internal Medicine

## 2022-01-26 ENCOUNTER — Other Ambulatory Visit (INDEPENDENT_AMBULATORY_CARE_PROVIDER_SITE_OTHER): Payer: BC Managed Care – PPO

## 2022-01-26 VITALS — BP 160/100 | HR 80 | Ht 67.0 in | Wt 215.0 lb

## 2022-01-26 DIAGNOSIS — R7989 Other specified abnormal findings of blood chemistry: Secondary | ICD-10-CM

## 2022-01-26 DIAGNOSIS — D135 Benign neoplasm of extrahepatic bile ducts: Secondary | ICD-10-CM

## 2022-01-26 DIAGNOSIS — Z860100 Personal history of colon polyps, unspecified: Secondary | ICD-10-CM

## 2022-01-26 DIAGNOSIS — Z8619 Personal history of other infectious and parasitic diseases: Secondary | ICD-10-CM | POA: Diagnosis not present

## 2022-01-26 LAB — HEPATIC FUNCTION PANEL
ALT: 27 U/L (ref 0–53)
AST: 22 U/L (ref 0–37)
Albumin: 4.2 g/dL (ref 3.5–5.2)
Alkaline Phosphatase: 59 U/L (ref 39–117)
Bilirubin, Direct: 0.1 mg/dL (ref 0.0–0.3)
Total Bilirubin: 0.8 mg/dL (ref 0.2–1.2)
Total Protein: 6.9 g/dL (ref 6.0–8.3)

## 2022-01-26 NOTE — Patient Instructions (Signed)
If you are age 59 or older, your body mass index should be between 23-30. Your Body mass index is 33.67 kg/m?Marland Kitchen If this is out of the aforementioned range listed, please consider follow up with your Primary Care Provider. ? ?If you are age 43 or younger, your body mass index should be between 19-25. Your Body mass index is 33.67 kg/m?Marland Kitchen If this is out of the aformentioned range listed, please consider follow up with your Primary Care Provider.  ? ?________________________________________________________ ? ?The  GI providers would like to encourage you to use [facility] to communicate with providers for non-urgent requests or questions.  Due to long hold times on the telephone, sending your provider a message by [facility] may be a faster and more efficient way to get a response.  Please allow 48 business hours for a response.  Please remember that this is for non-urgent requests.  ?_______________________________________________________ ? ?Your provider has requested that you go to the basement level for lab work before leaving today. Press "B" on the elevator. The lab is located at the first door on the left as you exit the elevator. ? ?Bring the stool test back in about a month ? ?

## 2022-01-26 NOTE — Progress Notes (Signed)
HISTORY OF PRESENT ILLNESS: ? ?Craig Hopkins is a 61 y.o. male with longstanding diabetes mellitus, coronary artery disease status post coronary artery intervention on Plavix, hypertension, hyperlipidemia, GERD, COPD, anxiety, multiple adenomatous colon polyps, and ampullary adenoma.  He presents today for follow-up.  I saw the patient in the office initially June 19, 2021 regarding positive Cologuard testing, chronic GERD with mild dysphagia, and the need for endoscopic evaluations.  See that dictation.  He subsequently underwent colonoscopy and upper endoscopy August 06, 2021.  Colonoscopy revealed multiple (19) adenomatous colon polyps including advanced lesions but no cancer.  Upper endoscopy revealed ampullary adenoma.  He subsequently underwent ERCP with ampullectomy.  Pathology was benign.  He did have a biliary stent placed.  He presented to the hospital 1 week later with abdominal pain with elevated lipase and markedly elevated liver tests.  Advanced imaging (CT) was unremarkable.  Biliary stent removed.  Has recovered since.  He was noted to have Helicobacter pylori.  He was treated with quadruple therapy.  He has a number of questions regarding insurance.  He has concerns over some office communications. ?Overall he is feeling better.  He does complain of a bit more fatigued than expected.  No active GI complaints.  Last liver test from December 26, 2021 revealed ALT of 64.  Alkaline phosphatase 130.  Other liver test normal.  Normal lipase. ? ?REVIEW OF SYSTEMS: ? ?All non-GI ROS negative unless otherwise stated in the HPI except for dizziness with standing ? ?Past Medical History:  ?Diagnosis Date  ? CAD (coronary artery disease)   ? COPD (chronic obstructive pulmonary disease) ([place])   ? GERD (gastroesophageal reflux disease)   ? HTN (hypertension)   ? Hypercholesteremia   ? Hyperlipidemia   ? IDDM (insulin dependent diabetes mellitus)   ? Neuromuscular disorder ([place])   ? Panic attack   ? ? ?Past  Surgical History:  ?Procedure Laterality Date  ? BILIARY DILATION  12/08/2021  ? Procedure: BILIARY DILATION;  Surgeon: Rush Landmark Telford Nab., MD;  Location: [facility];  Service: Gastroenterology;;  ? BILIARY STENT PLACEMENT N/A 12/08/2021  ? Procedure: BILIARY STENT PLACEMENT;  Surgeon: Rush Landmark Telford Nab., MD;  Location: [facility];  Service: Gastroenterology;  Laterality: N/A;  ? BIOPSY  12/08/2021  ? Procedure: BIOPSY;  Surgeon: Irving Copas., MD;  Location: [facility];  Service: Gastroenterology;;  ? CAROTID STENT  11/23/2006  ? COLONOSCOPY    ? ENDOSCOPIC MUCOSAL RESECTION  12/08/2021  ? Procedure: ENDOSCOPIC MUCOSAL RESECTION;  Surgeon: Rush Landmark Telford Nab., MD;  Location: [facility];  Service: Gastroenterology;;  Ampulectomy  ? ENDOSCOPIC RETROGRADE CHOLANGIOPANCREATOGRAPHY (ERCP) WITH PROPOFOL N/A 12/08/2021  ? Procedure: ENDOSCOPIC RETROGRADE CHOLANGIOPANCREATOGRAPHY (ERCP) WITH PROPOFOL;  Surgeon: Rush Landmark Telford Nab., MD;  Location: [facility];  Service: Gastroenterology;  Laterality: N/A;  ? ERCP N/A 12/18/2021  ? Procedure: ENDOSCOPIC RETROGRADE CHOLANGIOPANCREATOGRAPHY (ERCP);  Surgeon: Milus Banister, MD;  Location: [facility];  Service: Endoscopy;  Laterality: N/A;  ? ESOPHAGOGASTRODUODENOSCOPY (EGD) WITH PROPOFOL N/A 12/08/2021  ? Procedure: ESOPHAGOGASTRODUODENOSCOPY (EGD) WITH PROPOFOL;  Surgeon: Rush Landmark Telford Nab., MD;  Location: [facility];  Service: Gastroenterology;  Laterality: N/A;  ? EUS N/A 12/08/2021  ? Procedure: UPPER ENDOSCOPIC ULTRASOUND (EUS) LINEAR;  Surgeon: Rush Landmark Telford Nab., MD;  Location: [facility];  Service: Gastroenterology;  Laterality: N/A;  ? heart catherization  11/23/2009  ? HEMOSTASIS CONTROL  12/08/2021  ? Procedure: HEMOSTASIS CONTROL;  Surgeon: Rush Landmark Telford Nab., MD;  Location: [facility];  Service: Gastroenterology;;  ? left  wrist  11/23/1974  ? REMOVAL OF STONES  12/18/2021  ? Procedure: BALLOON SWEEP FOR STONES;   Surgeon: Milus Banister, MD;  Location: [facility];  Service: Endoscopy;;  ? SPHINCTEROTOMY  12/08/2021  ? Procedure: SPHINCTEROTOMY;  Surgeon: Mansouraty, Telford Nab., MD;  Location: [facility];  Service: Gastroenterology;;  Biliary  ? SPHINCTEROTOMY  12/08/2021  ? Procedure: SPHINCTEROTOMY;  Surgeon: Mansouraty, Telford Nab., MD;  Location: [facility];  Service: Gastroenterology;;  Pancreatic  ? STENT REMOVAL  12/18/2021  ? Procedure: STENT REMOVAL;  Surgeon: Milus Banister, MD;  Location: [facility];  Service: Endoscopy;;  ? SUBMUCOSAL LIFTING INJECTION  12/08/2021  ? Procedure: SUBMUCOSAL LIFTING INJECTION;  Surgeon: Irving Copas., MD;  Location: [facility];  Service: Gastroenterology;;  ? throat polyp removed  11/23/1994  ? TONSILLECTOMY  11/23/1998  ? UPPER GASTROINTESTINAL ENDOSCOPY    ? ? ?Social History ?Craig Hopkins  reports that he has quit smoking. He has quit using smokeless tobacco. He reports current alcohol use of about 1.0 standard drink per week. He reports current drug use. Drug: Marijuana. ? ?family history includes Breast cancer (age of onset: 65) in his mother; Cancer in an other family member; Coronary artery disease in his father and another family member; Stroke in his father. ? ?No Known Allergies ? ?  ? ?PHYSICAL EXAMINATION: ?Vital signs: BP (!) 160/100 (BP Location: Left Arm, Patient Position: Sitting, Cuff Size: Normal)   Pulse 80   Ht '5\' 7"'  (1.702 m) Comment: height measured without shoes  Wt 215 lb (97.5 kg)   BMI 33.67 kg/m?   ?Constitutional: generally well-appearing, no acute distress ?Psychiatric: alert and oriented x3, cooperative ?Eyes: extraocular movements intact, anicteric, conjunctiva pink ?Mouth: oral pharynx moist, no lesions ?Neck: supple no lymphadenopathy ?Cardiovascular: heart regular rate and rhythm, no murmur ?Lungs: clear to auscultation bilaterally ?Abdomen: soft, nontender, nondistended, no obvious ascites, no peritoneal signs, normal  bowel sounds, no organomegaly ?Rectal: Omitted ?Extremities: no clubbing, cyanosis, or lower extremity edema bilaterally ?Skin: no lesions on visible extremities ?Neuro: No focal deficits.  Cranial nerves intact ? ?ASSESSMENT: ? ?1.  Multiple adenomatous colon polyps September 2022 as described. ?2.  Ampullary adenoma status post ERCP with ampullectomy ?3.  Helicobacter pylori.  Status post medical therapy ?5.  GERD.  On PPI ?6.  Multiple significant medical problems insulin requiring diabetes and coronary artery disease on Plavix ? ? ?PLAN: ? ?1.  Repeat LFTs today ?2.  Will need follow-up H. pylori stool antigen testing.  We discussed. ?3.  Follow-up colonoscopy around September 2023.  He is aware ?4.  Follow-up ERCP to reassess the ampullectomy site.  Timing per Dr. Rush Landmark.  I will send him a copy of this note.  Patient is aware that Dr. Rush Landmark will reach out regarding follow-up endoscopic assessment. ?A total time of 30 minutes was spent preparing to see the patient, reviewing a myriad of tests, pathology, endoscopic procedures, and x-rays.  Obtaining comprehensive interval history, performing medically appropriate physical exam, counseling and educating the patient regarding the above listed issues, answering myriad of questions, ordering blood work and laboratory studies, discussing appropriate follow-up intervals, and documenting clinical information in the health record ? ? ?  ?

## 2022-01-26 NOTE — Telephone Encounter (Signed)
I have entered the recall as entered  ?

## 2022-01-26 NOTE — Telephone Encounter (Signed)
-----   Message from Irving Copas., MD sent at 01/26/2022  4:36 PM EST ----- ?JP, ?Thanks. ?He is on our list.  Now that he does not have a stent we will wait a full 6 months. ?Chong Sicilian, this patient needs his follow-up ERCP by July of this year. ?Thanks. ?GM ?----- Message ----- ?From: Irene Shipper, MD ?Sent: 01/26/2022   4:14 PM EST ?To: Irving Copas., MD ? ?Hi Gabe, ?Just wanted to confirm that this gentleman is on your list for follow-up assessment post ampullectomy.  Thanks ?John ? ?

## 2022-02-25 ENCOUNTER — Other Ambulatory Visit: Payer: BC Managed Care – PPO

## 2022-02-25 DIAGNOSIS — R7989 Other specified abnormal findings of blood chemistry: Secondary | ICD-10-CM | POA: Diagnosis not present

## 2022-02-25 DIAGNOSIS — Z86010 Personal history of colonic polyps: Secondary | ICD-10-CM | POA: Diagnosis not present

## 2022-02-25 DIAGNOSIS — Z8619 Personal history of other infectious and parasitic diseases: Secondary | ICD-10-CM

## 2022-02-26 LAB — HELICOBACTER PYLORI  SPECIAL ANTIGEN
MICRO NUMBER:: 13226058
SPECIMEN QUALITY: ADEQUATE

## 2022-02-27 DIAGNOSIS — M25519 Pain in unspecified shoulder: Secondary | ICD-10-CM | POA: Diagnosis not present

## 2022-02-27 DIAGNOSIS — R55 Syncope and collapse: Secondary | ICD-10-CM | POA: Diagnosis not present

## 2022-02-27 DIAGNOSIS — R001 Bradycardia, unspecified: Secondary | ICD-10-CM | POA: Diagnosis not present

## 2022-03-02 DIAGNOSIS — E109 Type 1 diabetes mellitus without complications: Secondary | ICD-10-CM | POA: Diagnosis not present

## 2022-03-02 DIAGNOSIS — E10319 Type 1 diabetes mellitus with unspecified diabetic retinopathy without macular edema: Secondary | ICD-10-CM | POA: Diagnosis not present

## 2022-03-02 DIAGNOSIS — Z794 Long term (current) use of insulin: Secondary | ICD-10-CM | POA: Diagnosis not present

## 2022-03-03 DIAGNOSIS — E109 Type 1 diabetes mellitus without complications: Secondary | ICD-10-CM | POA: Diagnosis not present

## 2022-03-03 DIAGNOSIS — E10319 Type 1 diabetes mellitus with unspecified diabetic retinopathy without macular edema: Secondary | ICD-10-CM | POA: Diagnosis not present

## 2022-03-03 DIAGNOSIS — Z794 Long term (current) use of insulin: Secondary | ICD-10-CM | POA: Diagnosis not present

## 2022-03-08 ENCOUNTER — Encounter: Payer: Self-pay | Admitting: Cardiovascular Disease

## 2022-03-08 NOTE — Progress Notes (Signed)
? ? ?Craig Hopkins ?Date of Birth  30-Jun-1961 ?[facility]       ? ?[address]     ?[place], [place]  [address]      ? [phone]    ? ?Problem List: ?1. CAD - s/p stenting of his LAD ?2. Diabetes Mellitus ?3. Hypertension ? ?  ? ?Brach is a 61 y.o. gentleman with a hx as noted above.  He has continued to have intermittant cp.  He has some dyspnea with exertion. His last cath was 2011 which revealed a patent LAD stent.  He's had some dyspnea on exertion but he admits that he has not been exercising at all. He still smokes a few cigarettes a week. ? ?Nov. 5, 2015: ? ?Craig Hopkins is doing well. ?He is still smoking ? ?In May he broke his shoulder (broke his humerous).  He had been running before them.  Started taking pain pills frequently. ?Lots of stress issues (family issues)  ?Through these strees, he started smoking more, exercising less.  ?Still has the same chest pain.  Lots of pain in the left arm.   ?Now, he has started running on the treadmill on a more regular basis.    He has some shoulder pain which resolves during his run.   ? ?Feb. 16, 2017 ? ?Still having CP . ?Still lots of family issues. ?Still has chest pain .  These have continued.   Not any different than the previous CP .   ?Still eating poorly .  Still smoking and drinking excessively .  ? ?HbA1c - 6.9,   ? ?March 05, 2020: ?Craig Hopkins  is seen today after a 4-year absence.  He has a history of coronary artery disease with stenting of his left anterior descending artery. ?Has been through a divorce since I last saw him. ?Stopped smoking but is vaping   ?Has been trying to exercise  ? ?Walking while plyaing golf this past week   ?Had some mild DOE that was out of the ordinary  ?He had some CP prior to his previous stenting  ? ?Getting the 2nd  [place] vaccine tomorrow ,  Is  A bit concerned about this  ? ?Feb. 1, 2022: ?Craig Hopkins is seen today for follow up visit for his CAD, DM, HLD  ?Wt is 224 lbs.  ?Has done well.  Was waking  regularly  ?Has not been walking recently  - 3.5 - 4 miles a day .  Was not having any chest pain  ?Diabetes is fairly well controlled.  ?Has lost a few lbs.  ?Still doing some pushups ?Wants to start an exercise program  ? ?March 09, 2022 ?Craig Hopkins is seen today for follow up of hsi CAD, CM, HLD ?Wt is 208 lbs  ?Had colonoscopy in Sept  - 19 polyps removed ?Had a pre-cancerous  lesion removed  ?Complicated by mild pancreatitis  ?Then developed acute hepatitis  ?No cardiac complications with these multiple procedures  ? ?Has started Walking( prior to all of these GI issues )  3 -4 miles a day , 4-5 days a week  ?Needs to restart his waking program  ?Eating schedule is off.  Difficult to manage with his diabetes  ?Has stopped drinking since jan. .. ? ?Still vaping some  ?Smokes some marijuana on occasion  ?Has some DOE   ?DOE with [place] with his sone recently ,   ? ?Current Outpatient Medications on File Prior to Visit  ?  Medication Sig Dispense Refill  ? ALPRAZolam (XANAX) 0.25 MG tablet Take 0.25 mg by mouth at bedtime as needed for anxiety or sleep.     ? amLODipine-benazepril (LOTREL) 5-10 MG capsule Take 1 capsule by mouth daily.    ? aspirin EC 81 MG tablet Take 1 tablet (81 mg total) by mouth daily.    ? atorvastatin (LIPITOR) 40 MG tablet Take 40 mg by mouth daily.    ? Carboxymethylcellul-Glycerin (CLEAR EYES FOR DRY EYES) 1-0.25 % SOLN Place 1 drop into both eyes daily as needed (dry eyes).    ? clopidogrel (PLAVIX) 75 MG tablet Take 1 tablet (75 mg total) by mouth daily.    ? Continuous Blood Gluc Sensor (FREESTYLE LIBRE 2 SENSOR) MISC USE TO CHECK GLUCOSE DAILY - REPLACE SENSOR EVERY 14 DAYS.    ? hydrochlorothiazide (MICROZIDE) 12.5 MG capsule Take 12.5 mg by mouth daily.    ? insulin aspart (NOVOLOG) 100 UNIT/ML injection Inject into the skin See admin instructions. Via Insulin Pump    ? Insulin Human (INSULIN PUMP) SOLN Inject into the skin.    ? Melatonin 5 MG CHEW Chew 5 mg by mouth at bedtime as  needed (sleep).    ? omeprazole (PRILOSEC) 20 MG capsule Take 1 capsule (20 mg total) by mouth daily. 60 capsule 6  ? tadalafil (CIALIS) 5 MG tablet Take 5 mg by mouth daily as needed for erectile dysfunction.    ? buPROPion (WELLBUTRIN XL) 150 MG 24 hr tablet Take 150 mg by mouth every morning. (Patient not taking: Reported on 03/09/2022)    ? ?No current facility-administered medications on file prior to visit.  ? ? ?No Known Allergies ? ?Past Medical History:  ?Diagnosis Date  ? CAD (coronary artery disease)   ? COPD (chronic obstructive pulmonary disease) ([place])   ? GERD (gastroesophageal reflux disease)   ? HTN (hypertension)   ? Hypercholesteremia   ? Hyperlipidemia   ? IDDM (insulin dependent diabetes mellitus)   ? Neuromuscular disorder ([place])   ? Panic attack   ? ? ?Past Surgical History:  ?Procedure Laterality Date  ? BILIARY DILATION  12/08/2021  ? Procedure: BILIARY DILATION;  Surgeon: Rush Landmark Telford Nab., MD;  Location: [facility];  Service: Gastroenterology;;  ? BILIARY STENT PLACEMENT N/A 12/08/2021  ? Procedure: BILIARY STENT PLACEMENT;  Surgeon: Rush Landmark Telford Nab., MD;  Location: [facility];  Service: Gastroenterology;  Laterality: N/A;  ? BIOPSY  12/08/2021  ? Procedure: BIOPSY;  Surgeon: Irving Copas., MD;  Location: [facility];  Service: Gastroenterology;;  ? CAROTID STENT  11/23/2006  ? COLONOSCOPY    ? ENDOSCOPIC MUCOSAL RESECTION  12/08/2021  ? Procedure: ENDOSCOPIC MUCOSAL RESECTION;  Surgeon: Rush Landmark Telford Nab., MD;  Location: [facility];  Service: Gastroenterology;;  Ampulectomy  ? ENDOSCOPIC RETROGRADE CHOLANGIOPANCREATOGRAPHY (ERCP) WITH PROPOFOL N/A 12/08/2021  ? Procedure: ENDOSCOPIC RETROGRADE CHOLANGIOPANCREATOGRAPHY (ERCP) WITH PROPOFOL;  Surgeon: Rush Landmark Telford Nab., MD;  Location: [facility];  Service: Gastroenterology;  Laterality: N/A;  ? ERCP N/A 12/18/2021  ? Procedure: ENDOSCOPIC RETROGRADE CHOLANGIOPANCREATOGRAPHY (ERCP);  Surgeon: Milus Banister, MD;  Location: [facility];  Service: Endoscopy;  Laterality: N/A;  ? ESOPHAGOGASTRODUODENOSCOPY (EGD) WITH PROPOFOL N/A 12/08/2021  ? Procedure: ESOPHAGOGASTRODUODENOSCOPY (EGD) WITH PROPOFOL;  Surgeon: Rush Landmark Telford Nab., MD;  Location: [facility];  Service: Gastroenterology;  Laterality: N/A;  ? EUS N/A 12/08/2021  ? Procedure: UPPER ENDOSCOPIC ULTRASOUND (EUS) LINEAR;  Surgeon: Rush Landmark Telford Nab., MD;  Location: [facility];  Service: Gastroenterology;  Laterality: N/A;  ?  heart catherization  11/23/2009  ? HEMOSTASIS CONTROL  12/08/2021  ? Procedure: HEMOSTASIS CONTROL;  Surgeon: Irving Copas., MD;  Location: [facility];  Service: Gastroenterology;;  ? left wrist  11/23/1974  ? REMOVAL OF STONES  12/18/2021  ? Procedure: BALLOON SWEEP FOR STONES;  Surgeon: Milus Banister, MD;  Location: [facility];  Service: Endoscopy;;  ? SPHINCTEROTOMY  12/08/2021  ? Procedure: SPHINCTEROTOMY;  Surgeon: Mansouraty, Telford Nab., MD;  Location: [facility];  Service: Gastroenterology;;  Biliary  ? SPHINCTEROTOMY  12/08/2021  ? Procedure: SPHINCTEROTOMY;  Surgeon: Mansouraty, Telford Nab., MD;  Location: [facility];  Service: Gastroenterology;;  Pancreatic  ? STENT REMOVAL  12/18/2021  ? Procedure: STENT REMOVAL;  Surgeon: Milus Banister, MD;  Location: [facility];  Service: Endoscopy;;  ? SUBMUCOSAL LIFTING INJECTION  12/08/2021  ? Procedure: SUBMUCOSAL LIFTING INJECTION;  Surgeon: Irving Copas., MD;  Location: [facility];  Service: Gastroenterology;;  ? throat polyp removed  11/23/1994  ? TONSILLECTOMY  11/23/1998  ? UPPER GASTROINTESTINAL ENDOSCOPY    ? ? ?Social History  ? ?Tobacco Use  ?Smoking Status Former  ?Smokeless Tobacco Former  ? ? ?Social History  ? ?Substance and Sexual Activity  ?Alcohol Use Yes  ? Alcohol/week: 1.0 standard drink  ? Types: 1 Standard drinks or equivalent per week  ? ? ?Family History  ?Problem Relation Age of Onset  ? Breast cancer Mother 72  ? Coronary  artery disease Father   ? Stroke Father   ? Cancer Other   ?     family history   ? Coronary artery disease Other   ?     family history   ? Esophageal cancer Neg Hx   ? Colon cancer Neg Hx   ? Rectal cance

## 2022-03-09 ENCOUNTER — Encounter: Payer: Self-pay | Admitting: Cardiovascular Disease

## 2022-03-09 ENCOUNTER — Ambulatory Visit (INDEPENDENT_AMBULATORY_CARE_PROVIDER_SITE_OTHER): Payer: BC Managed Care – PPO | Admitting: Cardiovascular Disease

## 2022-03-09 VITALS — BP 132/82 | HR 84 | Ht 67.0 in | Wt 208.0 lb

## 2022-03-09 DIAGNOSIS — I2510 Atherosclerotic heart disease of native coronary artery without angina pectoris: Secondary | ICD-10-CM

## 2022-03-09 DIAGNOSIS — E782 Mixed hyperlipidemia: Secondary | ICD-10-CM | POA: Diagnosis not present

## 2022-03-09 NOTE — Patient Instructions (Signed)
Medication Instructions:  ?Your physician recommends that you continue on your current medications as directed. Please refer to the Current Medication list given to you today. ? ?*If you need a refill on your cardiac medications before your next appointment, please call your pharmacy* ? ?Lab Work: ?TODAY: Lipids, ALT, BMP ?If you have labs (blood work) drawn today and your tests are completely normal, you will receive your results only by: ?MyChart Message (if you have MyChart) OR ?A paper copy in the mail ?If you have any lab test that is abnormal or we need to change your treatment, we will call you to review the results. ? ?Testing/Procedures: ?NONE ? ?Follow-Up: ?At [facility], you and your health needs are our priority.  As part of our continuing mission to provide you with exceptional heart care, we have created designated Provider Care Teams.  These Care Teams include your primary Cardiologist (physician) and Advanced Practice Providers (APPs -  Physician Assistants and Nurse Practitioners) who all work together to provide you with the care you need, when you need it. ? ?Your next appointment:   ?1 year(s) ? ?The format for your next appointment:   ?In Person ? ?Provider:   ?[name], MD  or Robbie Lis, PA-C, Christen Bame, NP, or Richardson Dopp, PA-C     ? ?Other Instructions ?Important Information About [place] ? ? ? ? ?  ?

## 2022-03-10 LAB — BASIC METABOLIC PANEL
BUN/Creatinine Ratio: 20 (ref 9–20)
BUN: 18 mg/dL (ref 6–24)
CO2: 24 mmol/L (ref 20–29)
Calcium: 9.6 mg/dL (ref 8.7–10.2)
Chloride: 103 mmol/L (ref 96–106)
Creatinine, Ser: 0.88 mg/dL (ref 0.76–1.27)
Glucose: 147 mg/dL — ABNORMAL HIGH (ref 70–99)
Potassium: 4.1 mmol/L (ref 3.5–5.2)
Sodium: 141 mmol/L (ref 134–144)
eGFR: 99 mL/min/{1.73_m2} (ref >59)

## 2022-03-10 LAB — ALT: ALT: 23 IU/L (ref 0–44)

## 2022-03-10 LAB — LIPID PANEL
Chol/HDL Ratio: 2.8 ratio (ref 0.0–5.0)
Cholesterol, Total: 133 mg/dL (ref 100–199)
HDL: 48 mg/dL (ref >39)
LDL Chol Calc (NIH): 68 mg/dL (ref 0–99)
Triglycerides: 86 mg/dL (ref 0–149)
VLDL Cholesterol Cal: 17 mg/dL (ref 5–40)

## 2022-03-23 DIAGNOSIS — E10319 Type 1 diabetes mellitus with unspecified diabetic retinopathy without macular edema: Secondary | ICD-10-CM | POA: Diagnosis not present

## 2022-05-08 DIAGNOSIS — E10319 Type 1 diabetes mellitus with unspecified diabetic retinopathy without macular edema: Secondary | ICD-10-CM | POA: Diagnosis not present

## 2022-05-11 ENCOUNTER — Telehealth: Payer: Self-pay

## 2022-05-11 NOTE — Telephone Encounter (Signed)
-----   Message from Irving Copas., MD sent at 05/11/2022  2:51 PM EDT ----- Regarding: Follow-up Stanton Kidney you have the recall for this patient's ERCP? Thanks. GM

## 2022-05-11 NOTE — Telephone Encounter (Signed)
Thanks for update. GM 

## 2022-05-11 NOTE — Telephone Encounter (Signed)
He is in for recall for July.

## 2022-06-11 DIAGNOSIS — Z794 Long term (current) use of insulin: Secondary | ICD-10-CM | POA: Diagnosis not present

## 2022-06-11 DIAGNOSIS — E109 Type 1 diabetes mellitus without complications: Secondary | ICD-10-CM | POA: Diagnosis not present

## 2022-06-11 DIAGNOSIS — E10319 Type 1 diabetes mellitus with unspecified diabetic retinopathy without macular edema: Secondary | ICD-10-CM | POA: Diagnosis not present

## 2022-07-13 DIAGNOSIS — H524 Presbyopia: Secondary | ICD-10-CM | POA: Diagnosis not present

## 2022-07-13 DIAGNOSIS — E109 Type 1 diabetes mellitus without complications: Secondary | ICD-10-CM | POA: Diagnosis not present

## 2022-07-13 DIAGNOSIS — H40013 Open angle with borderline findings, low risk, bilateral: Secondary | ICD-10-CM | POA: Diagnosis not present

## 2022-07-13 DIAGNOSIS — H5203 Hypermetropia, bilateral: Secondary | ICD-10-CM | POA: Diagnosis not present

## 2022-07-13 DIAGNOSIS — H2513 Age-related nuclear cataract, bilateral: Secondary | ICD-10-CM | POA: Diagnosis not present

## 2022-07-13 DIAGNOSIS — H43813 Vitreous degeneration, bilateral: Secondary | ICD-10-CM | POA: Diagnosis not present

## 2022-07-14 ENCOUNTER — Encounter: Payer: Self-pay | Admitting: Internal Medicine

## 2022-07-17 ENCOUNTER — Telehealth: Payer: Self-pay | Admitting: *Deleted

## 2022-07-17 NOTE — Chronic Care Management (AMB) (Signed)
  Care Coordination   Note   07/17/2022 Name: ZAKKARY THIBAULT MRN: [id number] DOB: 04-24-1961  DONACIANO RANGE is a 61 y.o. year old male who sees [place], Annie Main, MD for primary care. I reached out to Marvene Staff by phone today to offer care coordination services.  Mr. Menning was given information about Care Coordination services today including:   The Care Coordination services include support from the care team which includes your Nurse Coordinator, Clinical Social Worker, or Pharmacist.  The Care Coordination team is here to help remove barriers to the health concerns and goals most important to you. Care Coordination services are voluntary, and the patient may decline or stop services at any time by request to their care team member.   Care Coordination Consent Status: Patient agreed to services and verbal consent obtained.   Follow up plan:  Telephone appointment with care coordination team member scheduled for:  8/31  Encounter Outcome:  Pt. Scheduled   [place]  Direct Dial: [phone]

## 2022-07-23 ENCOUNTER — Ambulatory Visit: Payer: Self-pay

## 2022-07-23 NOTE — Patient Outreach (Signed)
  Care Coordination   07/23/2022 Name: Craig Hopkins MRN: [id number] DOB: 04/11/1961   Care Coordination Outreach Attempts:  An unsuccessful telephone outreach was attempted today to offer the patient information about available care coordination services as a benefit of their health plan.   Follow Up Plan:  Additional outreach attempts will be made to offer the patient care coordination information and services.   Encounter Outcome:  No Answer  Care Coordination Interventions Activated:  No   Care Coordination Interventions:  No, not indicated    Daneen Schick, BSW, CDP Social Worker, Certified Dementia Practitioner Care Coordination [phone]

## 2022-08-03 ENCOUNTER — Ambulatory Visit: Payer: Self-pay

## 2022-08-03 NOTE — Patient Instructions (Signed)
Visit Information  Thank you for taking time to visit with me today. Please don't hesitate to contact me if I can be of assistance to you.   Following are the goals we discussed today:   Goals Addressed             This Visit's Progress    COMPLETED: Care Coordination Activities       Care Coordination Interventions: SDoH screening performed - no acute resource challenges noted at this time Discussed the patient does not have an advance directive and is not interested in completing at this time Encouraged the patient to speak with his primary care provider during a future appointment if he would like to complete an advance directive Determined the patient does not have concerns with medication costs and/or adherence at this time Education provided on role of care coordination team - no follow up desired at this time Instructed the patient to contact his primary care provider as needed        Please call the care guide team at [phone] if you need to schedule an appointment with our care coordination team.  If you are experiencing a Mental Health or [place] or need someone to talk to, please call 1-800-273-TALK (toll free, 24 hour hotline)  Patient verbalizes understanding of instructions and care plan provided today and agrees to view in [place]. Active MyChart status and patient understanding of how to access instructions and care plan via MyChart confirmed with patient.     No further follow up required: Please contact your primary care provider as needed  Daneen Schick, BSW, CDP Social Worker, Certified Dementia Practitioner Care Coordination [phone]

## 2022-08-03 NOTE — Patient Outreach (Signed)
  Care Coordination   Initial Visit Note   08/03/2022 Name: Craig Hopkins MRN: [id number] DOB: September 17, 1961  Craig Hopkins is a 61 y.o. year old male who sees [place], Annie Main, MD for primary care. I spoke with  Craig Hopkins by phone today.  What matters to the patients health and wellness today?  No concerns, doing well at this time    Goals Addressed             This Visit's Progress    COMPLETED: Care Coordination Activities       Care Coordination Interventions: SDoH screening performed - no acute resource challenges noted at this time Discussed the patient does not have an advance directive and is not interested in completing at this time Encouraged the patient to speak with his primary care provider during a future appointment if he would like to complete an advance directive Determined the patient does not have concerns with medication costs and/or adherence at this time Education provided on role of care coordination team - no follow up desired at this time Instructed the patient to contact his primary care provider as needed        SDOH assessments and interventions completed:  Yes  SDOH Interventions Today    Flowsheet Row Most Recent Value  SDOH Interventions   Food Insecurity Interventions Intervention Not Indicated  Housing Interventions Intervention Not Indicated  Transportation Interventions Intervention Not Indicated  Utilities Interventions Intervention Not Indicated        Care Coordination Interventions Activated:  Yes  Care Coordination Interventions:  Yes, provided   Follow up plan: No further intervention required.   Encounter Outcome:  Pt. Visit Completed   Daneen Schick, BSW, CDP Social Worker, Certified Dementia Practitioner Care Coordination [phone]

## 2022-08-11 ENCOUNTER — Encounter: Payer: Self-pay | Admitting: Gastroenterology

## 2022-08-12 NOTE — Telephone Encounter (Signed)
Vaughan Basta, Please let the patient know that I am willing to move forward with ERCP and colonoscopy at same time. This can be scheduled with me in the hospital-based setting as I have availability.. Thanks. GM

## 2022-08-17 ENCOUNTER — Other Ambulatory Visit: Payer: Self-pay

## 2022-08-17 ENCOUNTER — Telehealth: Payer: Self-pay

## 2022-08-17 DIAGNOSIS — Z86010 Personal history of colonic polyps: Secondary | ICD-10-CM

## 2022-08-17 DIAGNOSIS — D135 Benign neoplasm of extrahepatic bile ducts: Secondary | ICD-10-CM

## 2022-08-17 NOTE — Telephone Encounter (Signed)
ERCP Colon has been scheduled for 12/7  at 730 am at [facility] with GM

## 2022-08-17 NOTE — Telephone Encounter (Signed)
[facility] (supporting Irving Copas., MD)       08/11/22  2:46 PM Dr. Rush Landmark,   I have received a notice to schedule my colonoscopy with Dr. Henrene Pastor.  You performed an Endoscopic Ampullectomy on Jan 16, and at the time you said that we would need to repeat the procedure in 6 months to make sure that the growth at the duodenum had not grown back as there is a chance that that may occur. You also said that you may be able to do the colonoscopy and the endoscopic procedure at the same time at the hospital.  If possible, I would prefer doing this all at one time. Can you let me know if this is an option and if so, I would need to schedule this before the end of the year as I have met my out-of-pocket maximum for this calendar year.     Thank you,   Craig Hopkins       08/11/22  3:06 PM Hunt, Molinda Bailiff, RN routed this conversation to Mansouraty, Telford Nab., MD  August 12, 2022 Mansouraty, Telford Nab., MD to [name], RN      08/12/22  5:18 PM Note Vaughan Basta, Please let the patient know that I am willing to move forward with ERCP and colonoscopy at same time. This can be scheduled with me in the hospital-based setting as I have availability.. Thanks. GM     August 13, 2022

## 2022-08-18 ENCOUNTER — Telehealth: Payer: Self-pay

## 2022-08-18 NOTE — Telephone Encounter (Signed)
   Patient Name: Craig Hopkins  DOB: 12-Aug-1961 MRN: [id number]  Primary Cardiologist: [name], MD  Chart reviewed as part of pre-operative protocol coverage.  [name] has ERCP scheduled 10/29/22.  Cath 2011 patent LAD stent. Myoview 2008 unremarkable. Last seen 02/2022 recommended for follow up 02/2023. Maintained on Aspirin, Clopidogrel. As per previous recommendations, may hold Plavix 5-7 days prior to planned procedure. MyChart message sent to relay these recommendations.   Pharmacy clearance only requested.   I will route this recommendation to the requesting party via Epic fax function and remove from pre-op pool.  Please call with questions.  Loel Dubonnet, NP 08/18/2022, 11:48 AM

## 2022-08-18 NOTE — Telephone Encounter (Signed)
ERCP/Colon  scheduled, pt instructed and medications reviewed.  Patient instructions mailed to home.  Patient to call with any questions or concerns.

## 2022-08-18 NOTE — Telephone Encounter (Signed)
Left message on machine to call back   Anti coag letter sent to [facility]

## 2022-08-18 NOTE — Telephone Encounter (Signed)
[facility] Pre-operative Risk Assessment     Request for surgical clearance:     Endoscopy Procedure  What type of surgery is being performed?     ERCP   When is this surgery scheduled?     12/7  What type of clearance is required ?   Pharmacy  Are there any medications that need to be held prior to surgery and how long? Plavix  Practice name and name of physician performing surgery?      [facility]  What is your office phone and fax number?      Phone- [phone]  [phone]  Anesthesia type (None, local, MAC, general) ?       MAC

## 2022-08-18 NOTE — Telephone Encounter (Signed)
Pt has been advised to hold plavix 5 days prior to his procedure

## 2022-08-19 NOTE — Progress Notes (Signed)
No chief complaint on file.

## 2022-09-15 DIAGNOSIS — Z794 Long term (current) use of insulin: Secondary | ICD-10-CM | POA: Diagnosis not present

## 2022-09-15 DIAGNOSIS — E109 Type 1 diabetes mellitus without complications: Secondary | ICD-10-CM | POA: Diagnosis not present

## 2022-09-15 DIAGNOSIS — E10319 Type 1 diabetes mellitus with unspecified diabetic retinopathy without macular edema: Secondary | ICD-10-CM | POA: Diagnosis not present

## 2022-09-23 DIAGNOSIS — E10319 Type 1 diabetes mellitus with unspecified diabetic retinopathy without macular edema: Secondary | ICD-10-CM | POA: Diagnosis not present

## 2022-09-23 DIAGNOSIS — Z23 Encounter for immunization: Secondary | ICD-10-CM | POA: Diagnosis not present

## 2022-10-21 ENCOUNTER — Encounter (HOSPITAL_COMMUNITY): Payer: Self-pay | Admitting: Gastroenterology

## 2022-10-22 NOTE — Progress Notes (Signed)
Attempted to obtain medical history via telephone, unable to reach at this time. HIPAA compliant voicemail message left requesting return call to pre surgical testing department. 

## 2022-10-23 ENCOUNTER — Encounter (HOSPITAL_COMMUNITY): Payer: Self-pay | Admitting: Gastroenterology

## 2022-10-26 MED ORDER — PLENVU 140 G PO SOLR: 1.0000 | ORAL | 0 refills | Status: DC

## 2022-10-28 NOTE — Anesthesia Preprocedure Evaluation (Addendum)
Anesthesia Evaluation  Patient identified by MRN, date of birth, ID band Patient awake    Reviewed: Allergy & Precautions, NPO status , Patient's Chart, lab work & pertinent test results  History of Anesthesia Complications Negative for: history of anesthetic complications  Airway Mallampati: III  TM Distance: >3 FB Neck ROM: Full   Comment: Previous grade II view with Miller 2, easy mask Dental   Reports several cracked teeth. Denies loose teeth.:   Pulmonary neg sleep apnea, COPD, neg recent URI, former smoker   Pulmonary exam normal breath sounds clear to auscultation       Cardiovascular hypertension (amlodipine-benazepril, HCTZ), Pt. on medications (-) angina + CAD and + Cardiac Stents (2011)  (-) CABG (-) dysrhythmias  Rhythm:Regular Rate:Normal  HLD  Cath 2011 patent LAD stent. Myoview 2008 unremarkable. Last seen 02/2022 recommended for follow up 02/2023. Maintained on Aspirin, Clopidogrel   Neuro/Psych neg Seizures PSYCHIATRIC DISORDERS (panic attacks) Anxiety      Neuromuscular disease    GI/Hepatic Neg liver ROS,GERD  ,,gastroparesis   Endo/Other  diabetes (patient removed his insulin pump this morning, BG 154.), Type 1, Insulin Dependent    Renal/GU negative Renal ROS     Musculoskeletal  (+) Arthritis ,    Abdominal  (+) + obese  Peds  Hematology negative hematology ROS (+)   Anesthesia Other Findings Ampullary adenoma  Last Plavix 10/24/22  Reproductive/Obstetrics                             Anesthesia Physical Anesthesia Plan  ASA: 3  Anesthesia Plan: General   Post-op Pain Management:    Induction: Intravenous  PONV Risk Score and Plan: 2 and Ondansetron and Dexamethasone  Airway Management Planned: Oral ETT  Additional Equipment:   Intra-op Plan:   Post-operative Plan:   Informed Consent: I have reviewed the patients History and Physical, chart, labs and  discussed the procedure including the risks, benefits and alternatives for the proposed anesthesia with the patient or authorized representative who has indicated his/her understanding and acceptance.     Dental advisory given  Plan Discussed with: CRNA and Anesthesiologist  Anesthesia Plan Comments: (Discussed with patient risks of MAC including, but not limited to, minor pain or discomfort, hearing people in the room, and possible need for backup general anesthesia. Risks for general anesthesia also discussed including, but not limited to, sore throat, hoarse voice, chipped/damaged teeth, injury to vocal cords, nausea and vomiting, allergic reactions, lung infection, heart attack, stroke, and death. All questions answered.   Risks of general anesthesia discussed including, but not limited to, sore throat, hoarse voice, chipped/damaged teeth, injury to vocal cords, nausea and vomiting, allergic reactions, lung infection, heart attack, stroke, and death. All questions answered. )        Anesthesia Quick Evaluation

## 2022-10-29 ENCOUNTER — Encounter (HOSPITAL_COMMUNITY)
Admission: RE | Disposition: A | Discharge status: Home / Self Care | Payer: Self-pay | Source: Home / Self Care | Attending: Gastroenterology

## 2022-10-29 ENCOUNTER — Ambulatory Visit (HOSPITAL_COMMUNITY): Payer: BC Managed Care – PPO | Admitting: Anesthesiology

## 2022-10-29 ENCOUNTER — Encounter (HOSPITAL_COMMUNITY): Payer: Self-pay | Admitting: Gastroenterology

## 2022-10-29 ENCOUNTER — Ambulatory Visit (HOSPITAL_COMMUNITY)
Admission: RE | Admit: 2022-10-29 | Discharge: 2022-10-29 | Disposition: A | Discharge status: Home / Self Care | Payer: BC Managed Care – PPO | Attending: Gastroenterology | Admitting: Gastroenterology

## 2022-10-29 ENCOUNTER — Other Ambulatory Visit: Payer: Self-pay

## 2022-10-29 DIAGNOSIS — Z79899 Other long term (current) drug therapy: Secondary | ICD-10-CM | POA: Diagnosis not present

## 2022-10-29 DIAGNOSIS — K319 Disease of stomach and duodenum, unspecified: Secondary | ICD-10-CM | POA: Diagnosis not present

## 2022-10-29 DIAGNOSIS — K317 Polyp of stomach and duodenum: Secondary | ICD-10-CM | POA: Diagnosis not present

## 2022-10-29 DIAGNOSIS — Z6833 Body mass index (BMI) 33.0-33.9, adult: Secondary | ICD-10-CM | POA: Insufficient documentation

## 2022-10-29 DIAGNOSIS — K5730 Diverticulosis of large intestine without perforation or abscess without bleeding: Secondary | ICD-10-CM | POA: Insufficient documentation

## 2022-10-29 DIAGNOSIS — K641 Second degree hemorrhoids: Secondary | ICD-10-CM | POA: Diagnosis not present

## 2022-10-29 DIAGNOSIS — I10 Essential (primary) hypertension: Secondary | ICD-10-CM | POA: Diagnosis not present

## 2022-10-29 DIAGNOSIS — D125 Benign neoplasm of sigmoid colon: Secondary | ICD-10-CM | POA: Diagnosis not present

## 2022-10-29 DIAGNOSIS — K3189 Other diseases of stomach and duodenum: Secondary | ICD-10-CM | POA: Insufficient documentation

## 2022-10-29 DIAGNOSIS — K3184 Gastroparesis: Secondary | ICD-10-CM | POA: Diagnosis not present

## 2022-10-29 DIAGNOSIS — Z794 Long term (current) use of insulin: Secondary | ICD-10-CM | POA: Diagnosis not present

## 2022-10-29 DIAGNOSIS — Z9641 Presence of insulin pump (external) (internal): Secondary | ICD-10-CM | POA: Insufficient documentation

## 2022-10-29 DIAGNOSIS — E1043 Type 1 diabetes mellitus with diabetic autonomic (poly)neuropathy: Secondary | ICD-10-CM | POA: Insufficient documentation

## 2022-10-29 DIAGNOSIS — K2289 Other specified disease of esophagus: Secondary | ICD-10-CM | POA: Diagnosis not present

## 2022-10-29 DIAGNOSIS — D124 Benign neoplasm of descending colon: Secondary | ICD-10-CM | POA: Insufficient documentation

## 2022-10-29 DIAGNOSIS — K31A0 Gastric intestinal metaplasia, unspecified: Secondary | ICD-10-CM

## 2022-10-29 DIAGNOSIS — E669 Obesity, unspecified: Secondary | ICD-10-CM | POA: Diagnosis not present

## 2022-10-29 DIAGNOSIS — K2950 Unspecified chronic gastritis without bleeding: Secondary | ICD-10-CM | POA: Diagnosis not present

## 2022-10-29 DIAGNOSIS — Z86010 Personal history of colonic polyps: Secondary | ICD-10-CM | POA: Insufficient documentation

## 2022-10-29 DIAGNOSIS — K449 Diaphragmatic hernia without obstruction or gangrene: Secondary | ICD-10-CM | POA: Diagnosis not present

## 2022-10-29 DIAGNOSIS — M1990 Unspecified osteoarthritis, unspecified site: Secondary | ICD-10-CM | POA: Insufficient documentation

## 2022-10-29 DIAGNOSIS — Z09 Encounter for follow-up examination after completed treatment for conditions other than malignant neoplasm: Secondary | ICD-10-CM | POA: Diagnosis not present

## 2022-10-29 DIAGNOSIS — D135 Benign neoplasm of extrahepatic bile ducts: Secondary | ICD-10-CM

## 2022-10-29 DIAGNOSIS — I2510 Atherosclerotic heart disease of native coronary artery without angina pectoris: Secondary | ICD-10-CM | POA: Insufficient documentation

## 2022-10-29 DIAGNOSIS — K219 Gastro-esophageal reflux disease without esophagitis: Secondary | ICD-10-CM | POA: Diagnosis not present

## 2022-10-29 HISTORY — PX: BIOPSY: SHX5522

## 2022-10-29 HISTORY — PX: ESOPHAGOGASTRODUODENOSCOPY (EGD) WITH PROPOFOL: SHX5813

## 2022-10-29 HISTORY — PX: COLONOSCOPY WITH PROPOFOL: SHX5780

## 2022-10-29 HISTORY — PX: POLYPECTOMY: SHX5525

## 2022-10-29 LAB — GLUCOSE, CAPILLARY
Glucose-Capillary: 126 mg/dL — ABNORMAL HIGH (ref 70–99)
Glucose-Capillary: 135 mg/dL — ABNORMAL HIGH (ref 70–99)
Glucose-Capillary: 179 mg/dL — ABNORMAL HIGH (ref 70–99)

## 2022-10-29 SURGERY — COLONOSCOPY WITH PROPOFOL
Anesthesia: General

## 2022-10-29 MED ORDER — LIDOCAINE 2% (20 MG/ML) 5 ML SYRINGE
INTRAMUSCULAR | Status: DC | PRN
  Administered 2022-10-29: 60 mg via INTRAVENOUS

## 2022-10-29 MED ORDER — GLUCAGON HCL RDNA (DIAGNOSTIC) 1 MG IJ SOLR
INTRAMUSCULAR | Status: AC
  Filled 2022-10-29: qty 1

## 2022-10-29 MED ORDER — DICLOFENAC SUPPOSITORY 100 MG
RECTAL | Status: AC
  Filled 2022-10-29: qty 1

## 2022-10-29 MED ORDER — ONDANSETRON HCL 4 MG/2ML IJ SOLN
INTRAMUSCULAR | Status: DC | PRN
  Administered 2022-10-29: 4 mg via INTRAVENOUS

## 2022-10-29 MED ORDER — SODIUM CHLORIDE 0.9 % IV SOLN: INTRAVENOUS | Status: DC

## 2022-10-29 MED ORDER — CIPROFLOXACIN IN D5W 400 MG/200ML IV SOLN
INTRAVENOUS | Status: AC
  Filled 2022-10-29: qty 200

## 2022-10-29 MED ORDER — LACTATED RINGERS IV SOLN: INTRAVENOUS | Status: DC

## 2022-10-29 MED ORDER — PROPOFOL 10 MG/ML IV BOLUS
INTRAVENOUS | Status: DC | PRN
  Administered 2022-10-29 (×2): 20 mg via INTRAVENOUS

## 2022-10-29 MED ORDER — PROPOFOL 10 MG/ML IV BOLUS
INTRAVENOUS | Status: AC
  Filled 2022-10-29: qty 20

## 2022-10-29 MED ORDER — SPOT INK MARKER SYRINGE KIT
PACK | SUBMUCOSAL | Status: AC
  Filled 2022-10-29: qty 5

## 2022-10-29 MED ORDER — PROPOFOL 500 MG/50ML IV EMUL
INTRAVENOUS | Status: AC
  Filled 2022-10-29: qty 50

## 2022-10-29 MED ORDER — FENTANYL CITRATE (PF) 100 MCG/2ML IJ SOLN
INTRAMUSCULAR | Status: AC
  Filled 2022-10-29: qty 2

## 2022-10-29 MED ORDER — PROMETHAZINE HCL 25 MG/ML IJ SOLN: 6.2500 mg | INTRAMUSCULAR | Status: DC | PRN

## 2022-10-29 MED ORDER — PROPOFOL 500 MG/50ML IV EMUL
INTRAVENOUS | Status: DC | PRN
  Administered 2022-10-29: 125 ug/kg/min via INTRAVENOUS

## 2022-10-29 MED ORDER — MIDAZOLAM HCL 2 MG/2ML IJ SOLN
INTRAMUSCULAR | Status: AC
  Filled 2022-10-29: qty 2

## 2022-10-29 MED ORDER — EPINEPHRINE 1 MG/10ML IJ SOSY
PREFILLED_SYRINGE | INTRAMUSCULAR | Status: AC
  Filled 2022-10-29: qty 10

## 2022-10-29 MED ORDER — FENTANYL CITRATE (PF) 100 MCG/2ML IJ SOLN: 25.0000 ug | INTRAMUSCULAR | Status: DC | PRN

## 2022-10-29 MED ORDER — MIDAZOLAM HCL 2 MG/2ML IJ SOLN
INTRAMUSCULAR | Status: DC | PRN
  Administered 2022-10-29: 2 mg via INTRAVENOUS

## 2022-10-29 MED ORDER — FENTANYL CITRATE (PF) 100 MCG/2ML IJ SOLN
25.0000 ug | Freq: Once | INTRAMUSCULAR | Status: AC
  Administered 2022-10-29: 25 ug via INTRAVENOUS

## 2022-10-29 MED ORDER — CLOPIDOGREL BISULFATE 75 MG PO TABS: 75.0000 mg | ORAL_TABLET | Freq: Every day | ORAL | Status: AC

## 2022-10-29 MED ORDER — FENTANYL CITRATE (PF) 250 MCG/5ML IJ SOLN
INTRAMUSCULAR | Status: AC
  Filled 2022-10-29: qty 5

## 2022-10-29 MED ORDER — FENTANYL CITRATE (PF) 250 MCG/5ML IJ SOLN
INTRAMUSCULAR | Status: DC | PRN
  Administered 2022-10-29: 100 ug via INTRAVENOUS

## 2022-10-29 SURGICAL SUPPLY — 22 items

## 2022-10-29 NOTE — Op Note (Signed)
[facility] Patient Name: Craig Hopkins Procedure Date: 10/29/2022 MRN: [id number] Attending MD: Justice Britain , MD, [id number] Date of Birth: Feb 11, 1961 CSN: [id number] Age: 61 Admit Type: Outpatient Procedure:                Upper GI endoscopy Indications:              Surveillance procedure, Follow-up of polyps in the                            duodenum (history of ampullary adenoma), Follow-up                            of intestinal metaplasia Providers:                Justice Britain, MD, Carlyn Reichert, RN, Darliss Cheney, Technician Referring MD:             Docia Chuck. Henrene Pastor, MD, Reynold Bowen Medicines:                Monitored Anesthesia Care Complications:            No immediate complications. Estimated Blood Loss:     Estimated blood loss was minimal. Procedure:                Pre-Anesthesia Assessment:                           - Prior to the procedure, a History and Physical                            was performed, and patient medications and                            allergies were reviewed. The patient's tolerance of                            previous anesthesia was also reviewed. The risks                            and benefits of the procedure and the sedation                            options and risks were discussed with the patient.                            All questions were answered, and informed consent                            was obtained. Prior Anticoagulants: The patient has                            taken Plavix (clopidogrel), last dose was 5 days  prior to procedure. ASA Grade Assessment: III - A                            patient with severe systemic disease. After                            reviewing the risks and benefits, the patient was                            deemed in satisfactory condition to undergo the                            procedure.                            After obtaining informed consent, the endoscope was                            passed under direct vision. Throughout the                            procedure, the patient's blood pressure, pulse, and                            oxygen saturations were monitored continuously. The                            [id number]-H190 ([id number]) Olympus endoscope was introduced                            through the mouth, and used to inject contrast into                            second part of duodenum. The [id number]-[id number] ([id number])                            Olympus duodenoscope was introduced through the                            mouth, and used to inject contrast into area of                            papilla. After obtaining informed consent, the                            endoscope was passed under direct vision.                            Throughout the procedure, the patient's blood                            pressure, pulse, and oxygen saturations were  monitored continuously.The upper GI endoscopy was                            accomplished without difficulty. The patient                            tolerated the procedure. Scope In: Scope Out: Findings:      No gross lesions were noted in the entire esophagus.      The Z-line was irregular and was found 39 cm from the incisors.      A 1 cm hiatal hernia was present.      Patchy mildly erythematous mucosa without bleeding was found in the       entire examined stomach. With a history of gastric intestinal       metaplasia, gastric mapping was performed. Biopsies were taken with a       cold forceps for histology from the antrum/incisura. Biopsies were taken       with a cold forceps for histology from the greater curve/lesser curve.       Biopsies were taken with a cold forceps for histology from the       cardia/fundus.      No gross lesions were noted in the duodenal bulb, in the first portion       of the duodenum and in  the second portion of the duodenum.      Localized moderately congested mucosa without active bleeding and with       no stigmata of bleeding was found surrounding the area of the papilla.      There was evidence of a patent previousampullectomy. This was       characterized by healthy appearing mucosa. No evidence of recurrence is       noted at this time. Impression:               - No gross lesions in the entire esophagus. Z-line                            irregular, 39 cm from the incisors.                           - 1 cm hiatal hernia.                           - Erythematous mucosa in the stomach. Gastric                            mapping biopsies performed for history of                            intestinal metaplasia.                           - No gross lesions in the duodenal bulb, in the                            first portion of the duodenum and in the second  portion of the duodenum.                           - Congested duodenal mucosa surrounding the major                            papula region.                           - Patent endoscopic ampullectomy site.                            Characterized by healthy appearing mucosa without                            evidence of recurrence.                           . Moderate Sedation:      Not Applicable - Patient had care per Anesthesia. Recommendation:           - Proceed to scheduled colonoscopy.                           - Continue present medications.                           - Await pathology results.                           - Repeat upper endoscopy for surveillance. From a                            gastric intestinal metaplasia perspective this                            would likely be a 3-year follow-up EGD. From the                            ampullary adenoma surveillance/follow-up                            perspective recommend a 1 to 2-year follow-up EGD.                             These procedures can be done by patient's primary                            gastroenterologist and I can be available in the                            future if needed.                           - The findings and recommendations were discussed  with the patient.                           - The findings and recommendations were discussed                            with the designated responsible adult. Procedure Code(s):        --- Professional ---                           [id number], Esophagogastroduodenoscopy, flexible,                            transoral; with biopsy, single or multiple Diagnosis Code(s):        --- Professional ---                           K22.89, Other specified disease of esophagus                           K44.9, Diaphragmatic hernia without obstruction or                            gangrene                           K31.89, Other diseases of stomach and duodenum                           Z98.0, Intestinal bypass and anastomosis status                           K31.7, Polyp of stomach and duodenum                           K31.A0, Gastric intestinal metaplasia, unspecified CPT copyright 2022 American Medical Association. All rights reserved. The codes documented in this report are preliminary and upon coder review may  be revised to meet current compliance requirements. Justice Britain, MD 10/29/2022 9:12:20 AM Number of Addenda: 0

## 2022-10-29 NOTE — Op Note (Signed)
[facility] Patient Name: Craig Hopkins Procedure Date: 10/29/2022 MRN: [id number] Attending MD: Justice Britain , MD, [id number] Date of Birth: 04/22/1961 CSN: [id number] Age: 61 Admit Type: Outpatient Procedure:                Colonoscopy Indications:              Surveillance: History of numerous 18 adenomas on                            last colonoscopy in 2022 Providers:                Justice Britain, MD, Carlyn Reichert, RN, [facility] Technician, Technician, Darliss Cheney, Technician Referring MD:             Docia Chuck. Henrene Pastor, MD, Ishmael Holter. [place], MD Medicines:                Monitored Anesthesia Care Complications:            No immediate complications. Estimated Blood Loss:     Estimated blood loss was minimal. Procedure:                Pre-Anesthesia Assessment:                           - Prior to the procedure, a History and Physical                            was performed, and patient medications and                            allergies were reviewed. The patient's tolerance of                            previous anesthesia was also reviewed. The risks                            and benefits of the procedure and the sedation                            options and risks were discussed with the patient.                            All questions were answered, and informed consent                            was obtained. Prior Anticoagulants: The patient has                            taken Plavix (clopidogrel), last dose was 5 days                            prior to procedure. ASA Grade Assessment: III - A  patient with severe systemic disease. After                            reviewing the risks and benefits, the patient was                            deemed in satisfactory condition to undergo the                            procedure.                           After obtaining informed consent, the  colonoscope                            was passed under direct vision. Throughout the                            procedure, the patient's blood pressure, pulse, and                            oxygen saturations were monitored continuously. The                            [id number]-[id number] ([id number]) Olympus colonoscope was                            introduced through the anus and advanced to the 5                            cm into the ileum. The colonoscopy was somewhat                            difficult due to significant looping. Successful                            completion of the procedure was aided by changing                            the patient's position, using manual pressure,                            straightening and shortening the scope to obtain                            bowel loop reduction and using scope torsion. The                            patient tolerated the procedure. The quality of the                            bowel preparation was adequate. The terminal ileum,  ileocecal valve, appendiceal orifice, and rectum                            were photographed. Scope In: 0:93:23 AM Scope Out: 8:36:23 AM Scope Withdrawal Time: 0 hours 16 minutes 7 seconds  Total Procedure Duration: 0 hours 20 minutes 5 seconds  Findings:      The digital rectal exam findings include hemorrhoids. Pertinent       negatives include no palpable rectal lesions.      The colon (entire examined portion) revealed significantly excessive       looping.      The terminal ileum and ileocecal valve appeared normal.      Six sessile polyps were found in the sigmoid colon (2) and descending       colon (4). The polyps were 2 to 6 mm in size. These polyps were removed       with a cold snare. Resection and retrieval were complete.      A tattoo was seen in the descending colon.      Multiple small-mouthed diverticula were found in the recto-sigmoid colon       and  sigmoid colon.      Normal mucosa was found in the entire colon otherwise.      Non-bleeding non-thrombosed internal hemorrhoids were found during       retroflexion, during perianal exam and during digital exam. The       hemorrhoids were Grade II (internal hemorrhoids that prolapse but reduce       spontaneously). Impression:               - Hemorrhoids found on digital rectal exam.                           - There was significant looping of the colon.                           - The examined portion of the ileum was normal.                           - Six 2 to 6 mm polyps in the sigmoid colon and in                            the descending colon, removed with a cold snare.                            Resected and retrieved.                           - A tattoo was seen in the descending colon.                           - Diverticulosis in the recto-sigmoid colon and in                            the sigmoid colon.                           - Normal mucosa in  the entire examined colon.                           - Non-bleeding non-thrombosed internal hemorrhoids. Moderate Sedation:      Not Applicable - Patient had care per Anesthesia. Recommendation:           - The patient will be observed post-procedure,                            until all discharge criteria are met.                           - Discharge patient to home.                           - Patient has a contact number available for                            emergencies. The signs and symptoms of potential                            delayed complications were discussed with the                            patient. Return to normal activities tomorrow.                            Written discharge instructions were provided to the                            patient.                           - High fiber diet.                           - Use FiberCon 1-2 tablets PO daily.                           - Continue present  medications.                           - Await pathology results.                           - Repeat colonoscopy in 3 years for surveillance.                           - The findings and recommendations were discussed                            with the patient.                           - The findings and recommendations were discussed  with the designated responsible adult. Procedure Code(s):        --- Professional ---                           [phone], Colonoscopy, flexible; with removal of                            tumor(s), polyp(s), or other lesion(s) by snare                            technique Diagnosis Code(s):        --- Professional ---                           Z86.010, Personal history of colonic polyps                           K64.1, Second degree hemorrhoids                           D12.5, Benign neoplasm of sigmoid colon                           D12.4, Benign neoplasm of descending colon                           K57.30, Diverticulosis of large intestine without                            perforation or abscess without bleeding CPT copyright 2022 American Medical Association. All rights reserved. The codes documented in this report are preliminary and upon coder review may  be revised to meet current compliance requirements. Justice Britain, MD 10/29/2022 9:11:12 AM Number of Addenda: 0

## 2022-10-29 NOTE — Discharge Instructions (Signed)
YOU HAD AN ENDOSCOPIC PROCEDURE TODAY: Refer to the procedure report and other information in the discharge instructions given to you for any specific questions about what was found during the examination. If this information does not answer your questions, please call [place] office at [phone] to clarify.  ° °YOU SHOULD EXPECT: Some feelings of bloating in the abdomen. Passage of more gas than usual. Walking can help get rid of the air that was put into your GI tract during the procedure and reduce the bloating. If you had a lower endoscopy (such as a colonoscopy or flexible sigmoidoscopy) you may notice spotting of blood in your stool or on the toilet paper. Some abdominal soreness may be present for a day or two, also. ° °DIET: Your first meal following the procedure should be a light meal and then it is ok to progress to your normal diet. A half-sandwich or bowl of soup is an example of a good first meal. Heavy or fried foods are harder to digest and may make you feel nauseous or bloated. Drink plenty of fluids but you should avoid alcoholic beverages for 24 hours.  ° °ACTIVITY: Your care partner should take you home directly after the procedure. You should plan to take it easy, moving slowly for the rest of the day. You can resume normal activity the day after the procedure however YOU SHOULD NOT DRIVE, use power tools, machinery or perform tasks that involve climbing or major physical exertion for 24 hours (because of the sedation medicines used during the test).  ° °SYMPTOMS TO REPORT IMMEDIATELY: °A gastroenterologist can be reached at any hour. Please call [phone]  for any of the following symptoms:  °Following lower endoscopy (colonoscopy, flexible sigmoidoscopy) °Excessive amounts of blood in the stool  °Significant tenderness, worsening of abdominal pains  °Swelling of the abdomen that is new, acute  °Fever of 100° or higher  °Following upper endoscopy (EGD, EUS, ERCP, esophageal  dilation) °Vomiting of blood or coffee ground material  °New, significant abdominal pain  °New, significant chest pain or pain under the shoulder blades  °Painful or persistently difficult swallowing  °New shortness of breath  °Black, tarry-looking or red, bloody stools ° °FOLLOW UP:  °If any biopsies were taken you will be contacted by phone or by letter within the next 1-3 weeks. Call [phone]  if you have not heard about the biopsies in 3 weeks.  °Please also call with any specific questions about appointments or follow up tests.  °

## 2022-10-29 NOTE — H&P (Signed)
GASTROENTEROLOGY PROCEDURE H&P NOTE   Primary Care Physician: Reynold Bowen, MD  HPI: Craig Hopkins is a 61 y.o. male who presents for EGD/ERCP/Colonoscopy for history of ampullary adenoma s/p endoscopic resection and for Colon Cancer screening and colon polyp surveillance.  Past Medical History:  Diagnosis Date   CAD (coronary artery disease)    COPD (chronic obstructive pulmonary disease) (HCC)    GERD (gastroesophageal reflux disease)    HTN (hypertension)    Hypercholesteremia    Hyperlipidemia    IDDM (insulin dependent diabetes mellitus)    Neuromuscular disorder ([place])    Panic attack    Past Surgical History:  Procedure Laterality Date   BILIARY DILATION  12/08/2021   Procedure: BILIARY DILATION;  Surgeon: Rush Landmark Telford Nab., MD;  Location: [facility];  Service: Gastroenterology;;   BILIARY STENT PLACEMENT N/A 12/08/2021   Procedure: BILIARY STENT PLACEMENT;  Surgeon: Irving Copas., MD;  Location: [facility];  Service: Gastroenterology;  Laterality: N/A;   BIOPSY  12/08/2021   Procedure: BIOPSY;  Surgeon: Irving Copas., MD;  Location: [facility];  Service: Gastroenterology;;   CAROTID STENT  11/23/2006   COLONOSCOPY     ENDOSCOPIC MUCOSAL RESECTION  12/08/2021   Procedure: ENDOSCOPIC MUCOSAL RESECTION;  Surgeon: Rush Landmark Telford Nab., MD;  Location: [facility];  Service: Gastroenterology;;  Ampulectomy   ENDOSCOPIC RETROGRADE CHOLANGIOPANCREATOGRAPHY (ERCP) WITH PROPOFOL N/A 12/08/2021   Procedure: ENDOSCOPIC RETROGRADE CHOLANGIOPANCREATOGRAPHY (ERCP) WITH PROPOFOL;  Surgeon: Irving Copas., MD;  Location: [facility];  Service: Gastroenterology;  Laterality: N/A;   ERCP N/A 12/18/2021   Procedure: ENDOSCOPIC RETROGRADE CHOLANGIOPANCREATOGRAPHY (ERCP);  Surgeon: Milus Banister, MD;  Location: [facility];  Service: Endoscopy;  Laterality: N/A;   ESOPHAGOGASTRODUODENOSCOPY (EGD) WITH PROPOFOL N/A 12/08/2021   Procedure:  ESOPHAGOGASTRODUODENOSCOPY (EGD) WITH PROPOFOL;  Surgeon: Rush Landmark Telford Nab., MD;  Location: [facility];  Service: Gastroenterology;  Laterality: N/A;   EUS N/A 12/08/2021   Procedure: UPPER ENDOSCOPIC ULTRASOUND (EUS) LINEAR;  Surgeon: Irving Copas., MD;  Location: [facility];  Service: Gastroenterology;  Laterality: N/A;   heart catherization  11/23/2009   HEMOSTASIS CONTROL  12/08/2021   Procedure: HEMOSTASIS CONTROL;  Surgeon: Irving Copas., MD;  Location: [facility];  Service: Gastroenterology;;   left wrist  11/23/1974   REMOVAL OF STONES  12/18/2021   Procedure: Karlyn Agee FOR STONES;  Surgeon: Milus Banister, MD;  Location: [facility];  Service: Endoscopy;;   SPHINCTEROTOMY  12/08/2021   Procedure: Joan Mayans;  Surgeon: Mansouraty, Telford Nab., MD;  Location: [facility];  Service: Gastroenterology;;  Biliary   SPHINCTEROTOMY  12/08/2021   Procedure: Joan Mayans;  Surgeon: Mansouraty, Telford Nab., MD;  Location: [facility];  Service: Gastroenterology;;  Pancreatic   STENT REMOVAL  12/18/2021   Procedure: STENT REMOVAL;  Surgeon: Milus Banister, MD;  Location: [facility];  Service: Endoscopy;;   SUBMUCOSAL LIFTING INJECTION  12/08/2021   Procedure: SUBMUCOSAL LIFTING INJECTION;  Surgeon: Irving Copas., MD;  Location: [facility];  Service: Gastroenterology;;   throat polyp removed  11/23/1994   TONSILLECTOMY  11/23/1998   UPPER GASTROINTESTINAL ENDOSCOPY     Current Facility-Administered Medications  Medication Dose Route Frequency Provider Last Rate Last Admin   0.9 %  sodium chloride infusion   Intravenous Continuous Mansouraty, Telford Nab., MD       lactated ringers infusion   Intravenous Continuous Mansouraty, Telford Nab., MD 10 mL/hr at 10/29/22 0721 New Bag at 10/29/22 3235    Current Facility-Administered Medications:  0.9 %  sodium chloride infusion, , Intravenous, Continuous, Mansouraty, Telford Nab., MD   lactated  ringers infusion, , Intravenous, Continuous, Mansouraty, Telford Nab., MD, Last Rate: 10 mL/hr at 10/29/22 0721, New Bag at 10/29/22 0721 No Known Allergies Family History  Problem Relation Age of Onset   Breast cancer Mother 59   Coronary artery disease Father    Stroke Father    Cancer Other        family history    Coronary artery disease Other        family history    Esophageal cancer Neg Hx    Colon cancer Neg Hx    Rectal cancer Neg Hx    Stomach cancer Neg Hx    Inflammatory bowel disease Neg Hx    Liver disease Neg Hx    Pancreatic cancer Neg Hx    Social History   Socioeconomic History   Marital status: Divorced    Spouse name: Not on file   Number of children: Not on file   Years of education: [employer]   Highest education level: Not on file  Occupational History   Not on file  Tobacco Use   Smoking status: Former   Smokeless tobacco: Former  Scientific laboratory technician Use: Every day  Substance and Sexual Activity   Alcohol use: Yes    Alcohol/week: 1.0 standard drink of alcohol    Types: 1 Standard drinks or equivalent per week   Drug use: Yes    Types: Marijuana    Comment: last use 1 1/2 weeks ago early sep 2022   Sexual activity: Not on file  Other Topics Concern   Not on file  Social History Narrative   Not on file   Social Determinants of Health   Financial Resource Strain: Not on file  Food Insecurity: No Food Insecurity (08/03/2022)   Hunger Vital Sign    Worried About Running Out of Food in the Last Year: Never true    Ran Out of Food in the Last Year: Never true  Transportation Needs: No Transportation Needs (08/03/2022)   PRAPARE - Hydrologist (Medical): No    Lack of Transportation (Non-Medical): No  Physical Activity: Not on file  Stress: Not on file  Social Connections: Not on file  Intimate Partner Violence: Not on file    Physical Exam: Today's Vitals   10/23/22 1153 10/29/22 0701  BP:  (!) 118/116   Pulse:  85  Resp:  14  Temp:  98 F (36.7 C)  TempSrc:  Temporal  SpO2:  98%  Weight: 97.5 kg   Height: '5\' 7"'$  (1.702 m)   PainSc:  0-No pain   Body mass index is 33.67 kg/m. GEN: NAD EYE: Sclerae anicteric ENT: MMM CV: Non-tachycardic GI: Soft, NT/ND NEURO:  Alert & Oriented x 3  Lab Results: No results for input(s): "WBC", "HGB", "HCT", "PLT" in the last 72 hours. BMET No results for input(s): "NA", "K", "CL", "CO2", "GLUCOSE", "BUN", "CREATININE", "CALCIUM" in the last 72 hours. LFT No results for input(s): "PROT", "ALBUMIN", "AST", "ALT", "ALKPHOS", "BILITOT", "BILIDIR", "IBILI" in the last 72 hours. PT/INR No results for input(s): "LABPROT", "INR" in the last 72 hours.   Impression / Plan: This is a 61 y.o.male who presents for EGD/ERCP/Colonoscopy for history of ampullary adenoma s/p endoscopic resection and for Colon Cancer screening and colon polyp surveillance.  The risks and benefits of endoscopic evaluation/treatment were discussed with the patient and/or  family; these include but are not limited to the risk of perforation, infection, bleeding, missed lesions, lack of diagnosis, severe illness requiring hospitalization, as well as anesthesia and sedation related illnesses.  The patient's history has been reviewed, patient examined, no change in status, and deemed stable for procedure.  The patient and/or family is agreeable to proceed.    Justice Britain, MD [facility] Office # [phone]

## 2022-10-29 NOTE — Anesthesia Postprocedure Evaluation (Signed)
Anesthesia Post Note  Patient: Craig Hopkins  Procedure(s) Performed: COLONOSCOPY WITH PROPOFOL ESOPHAGOGASTRODUODENOSCOPY (EGD) WITH PROPOFOL BIOPSY POLYPECTOMY     Patient location during evaluation: PACU Anesthesia Type: General Level of consciousness: awake Pain management: pain level controlled Vital Signs Assessment: post-procedure vital signs reviewed and stable Respiratory status: spontaneous breathing, nonlabored ventilation and respiratory function stable Cardiovascular status: stable and blood pressure returned to baseline Postop Assessment: no apparent nausea or vomiting Anesthetic complications: no   No notable events documented.  Last Vitals:  Vitals:   10/29/22 0925 10/29/22 0931  BP: 129/85 (!) 136/91  Pulse: (!) 58 60  Resp: 10 12  Temp:    SpO2: 94% 93%    Last Pain:  Vitals:   10/29/22 0931  TempSrc:   PainSc: 2                  Nilda Simmer

## 2022-10-29 NOTE — Anesthesia Procedure Notes (Signed)
Procedure Name: MAC Date/Time: 10/29/2022 7:47 AM  Performed by: Lollie Sails, CRNAPre-anesthesia Checklist: Patient identified, Emergency Drugs available, Suction available, Patient being monitored and Timeout performed Oxygen Delivery Method: Simple face mask Preoxygenation: POM used. Placement Confirmation: positive ETCO2

## 2022-10-29 NOTE — Transfer of Care (Signed)
Immediate Anesthesia Transfer of Care Note  Patient: Craig Hopkins  Procedure(s) Performed: COLONOSCOPY WITH PROPOFOL ESOPHAGOGASTRODUODENOSCOPY (EGD) WITH PROPOFOL BIOPSY POLYPECTOMY  Patient Location: PACU and Endoscopy Unit  Anesthesia Type:MAC  Level of Consciousness: awake, drowsy, and patient cooperative  Airway & Oxygen Therapy: Patient Spontanous Breathing and Patient connected to face mask oxygen  Post-op Assessment: Report given to RN and Post -op Vital signs reviewed and stable  Post vital signs: Reviewed and stable  Last Vitals:  Vitals Value Taken Time  BP 137/60 10/29/22 0843  Temp    Pulse 74 10/29/22 0844  Resp 17 10/29/22 0844  SpO2 100 % 10/29/22 0844  Vitals shown include unvalidated device data.  Last Pain:  Vitals:   10/29/22 0701  TempSrc: Temporal  PainSc: 0-No pain         Complications: No notable events documented.

## 2022-11-01 ENCOUNTER — Encounter: Payer: Self-pay | Admitting: Gastroenterology

## 2022-11-02 ENCOUNTER — Encounter (HOSPITAL_COMMUNITY): Payer: Self-pay | Admitting: Gastroenterology

## 2022-11-02 LAB — SURGICAL PATHOLOGY

## 2023-01-07 DIAGNOSIS — Z794 Long term (current) use of insulin: Secondary | ICD-10-CM | POA: Diagnosis not present

## 2023-01-07 DIAGNOSIS — E10319 Type 1 diabetes mellitus with unspecified diabetic retinopathy without macular edema: Secondary | ICD-10-CM | POA: Diagnosis not present

## 2023-01-07 DIAGNOSIS — E109 Type 1 diabetes mellitus without complications: Secondary | ICD-10-CM | POA: Diagnosis not present

## 2023-01-28 DIAGNOSIS — K858 Other acute pancreatitis without necrosis or infection: Secondary | ICD-10-CM | POA: Diagnosis not present

## 2023-01-28 DIAGNOSIS — I2510 Atherosclerotic heart disease of native coronary artery without angina pectoris: Secondary | ICD-10-CM | POA: Diagnosis not present

## 2023-01-28 DIAGNOSIS — Z79899 Other long term (current) drug therapy: Secondary | ICD-10-CM | POA: Diagnosis not present

## 2023-01-28 DIAGNOSIS — E785 Hyperlipidemia, unspecified: Secondary | ICD-10-CM | POA: Diagnosis not present

## 2023-01-28 DIAGNOSIS — I700 Atherosclerosis of aorta: Secondary | ICD-10-CM | POA: Diagnosis not present

## 2023-01-28 DIAGNOSIS — I10 Essential (primary) hypertension: Secondary | ICD-10-CM | POA: Diagnosis not present

## 2023-01-28 DIAGNOSIS — E103553 Type 1 diabetes mellitus with stable proliferative diabetic retinopathy, bilateral: Secondary | ICD-10-CM | POA: Diagnosis not present

## 2023-01-28 DIAGNOSIS — E10319 Type 1 diabetes mellitus with unspecified diabetic retinopathy without macular edema: Secondary | ICD-10-CM | POA: Diagnosis not present

## 2023-01-28 DIAGNOSIS — K8581 Other acute pancreatitis with uninfected necrosis: Secondary | ICD-10-CM | POA: Diagnosis not present

## 2023-05-26 DIAGNOSIS — R457 State of emotional shock and stress, unspecified: Secondary | ICD-10-CM | POA: Diagnosis not present

## 2023-05-26 DIAGNOSIS — R000 Tachycardia, unspecified: Secondary | ICD-10-CM | POA: Diagnosis not present

## 2023-05-26 DIAGNOSIS — I10 Essential (primary) hypertension: Secondary | ICD-10-CM | POA: Diagnosis not present

## 2023-05-31 DIAGNOSIS — E10319 Type 1 diabetes mellitus with unspecified diabetic retinopathy without macular edema: Secondary | ICD-10-CM | POA: Diagnosis not present

## 2023-05-31 DIAGNOSIS — I10 Essential (primary) hypertension: Secondary | ICD-10-CM | POA: Diagnosis not present

## 2023-05-31 DIAGNOSIS — Z0000 Encounter for general adult medical examination without abnormal findings: Secondary | ICD-10-CM | POA: Diagnosis not present

## 2023-05-31 DIAGNOSIS — F410 Panic disorder [episodic paroxysmal anxiety] without agoraphobia: Secondary | ICD-10-CM | POA: Diagnosis not present

## 2023-05-31 DIAGNOSIS — K858 Other acute pancreatitis without necrosis or infection: Secondary | ICD-10-CM | POA: Diagnosis not present

## 2023-07-21 DIAGNOSIS — E109 Type 1 diabetes mellitus without complications: Secondary | ICD-10-CM | POA: Diagnosis not present

## 2023-07-21 DIAGNOSIS — H524 Presbyopia: Secondary | ICD-10-CM | POA: Diagnosis not present

## 2023-07-21 DIAGNOSIS — H2513 Age-related nuclear cataract, bilateral: Secondary | ICD-10-CM | POA: Diagnosis not present

## 2023-07-21 DIAGNOSIS — H40013 Open angle with borderline findings, low risk, bilateral: Secondary | ICD-10-CM | POA: Diagnosis not present

## 2023-07-21 DIAGNOSIS — H5203 Hypermetropia, bilateral: Secondary | ICD-10-CM | POA: Diagnosis not present

## 2023-08-03 DIAGNOSIS — E10319 Type 1 diabetes mellitus with unspecified diabetic retinopathy without macular edema: Secondary | ICD-10-CM | POA: Diagnosis not present

## 2023-08-03 DIAGNOSIS — Z794 Long term (current) use of insulin: Secondary | ICD-10-CM | POA: Diagnosis not present

## 2023-08-03 DIAGNOSIS — E109 Type 1 diabetes mellitus without complications: Secondary | ICD-10-CM | POA: Diagnosis not present

## 2023-09-21 IMAGING — DX DG ABDOMEN 2V
3 series · 3 of 3 positions shown · non-contrast
Comparison: CT abdomen and pelvis 12/14/2021.

CLINICAL DATA: Abdominal pain, evaluate for pancreatitis. History
of biliary stent placement on 12/08/2021.

EXAM:
ABDOMEN - 2 VIEW

[abdomen erect]
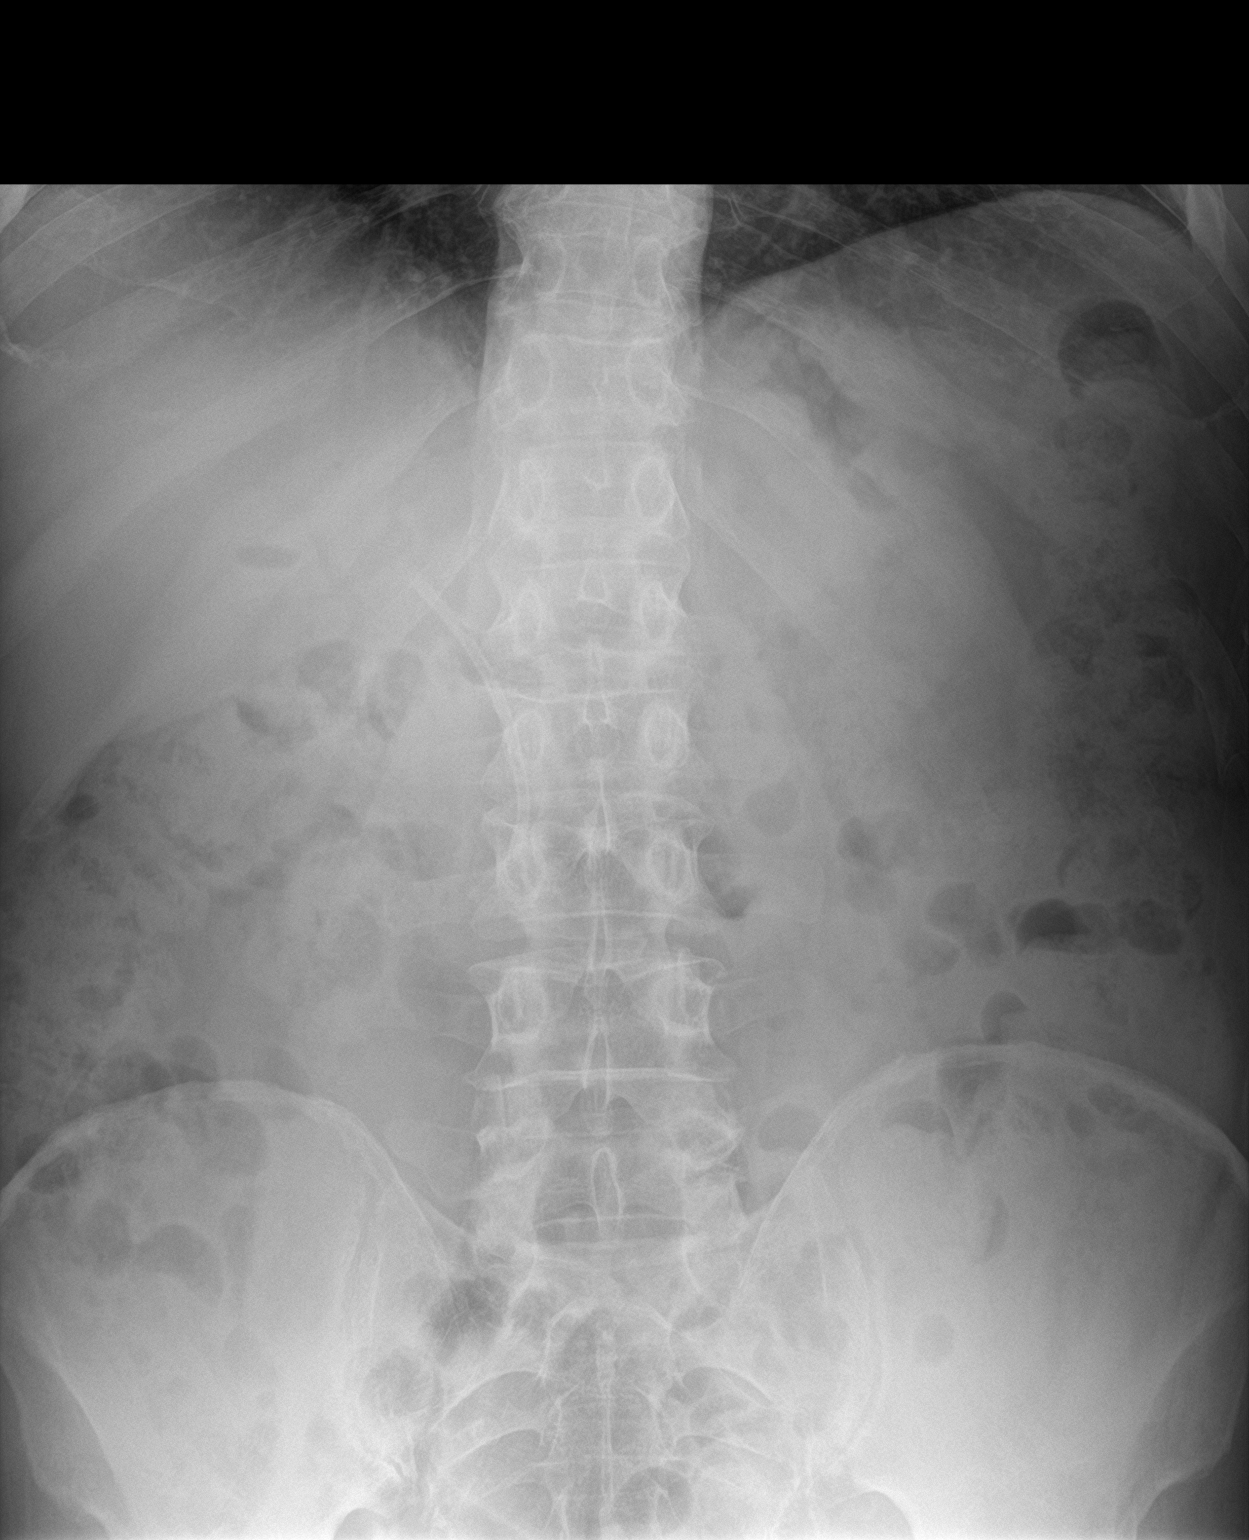

[abdomen supine (1 of 2)]
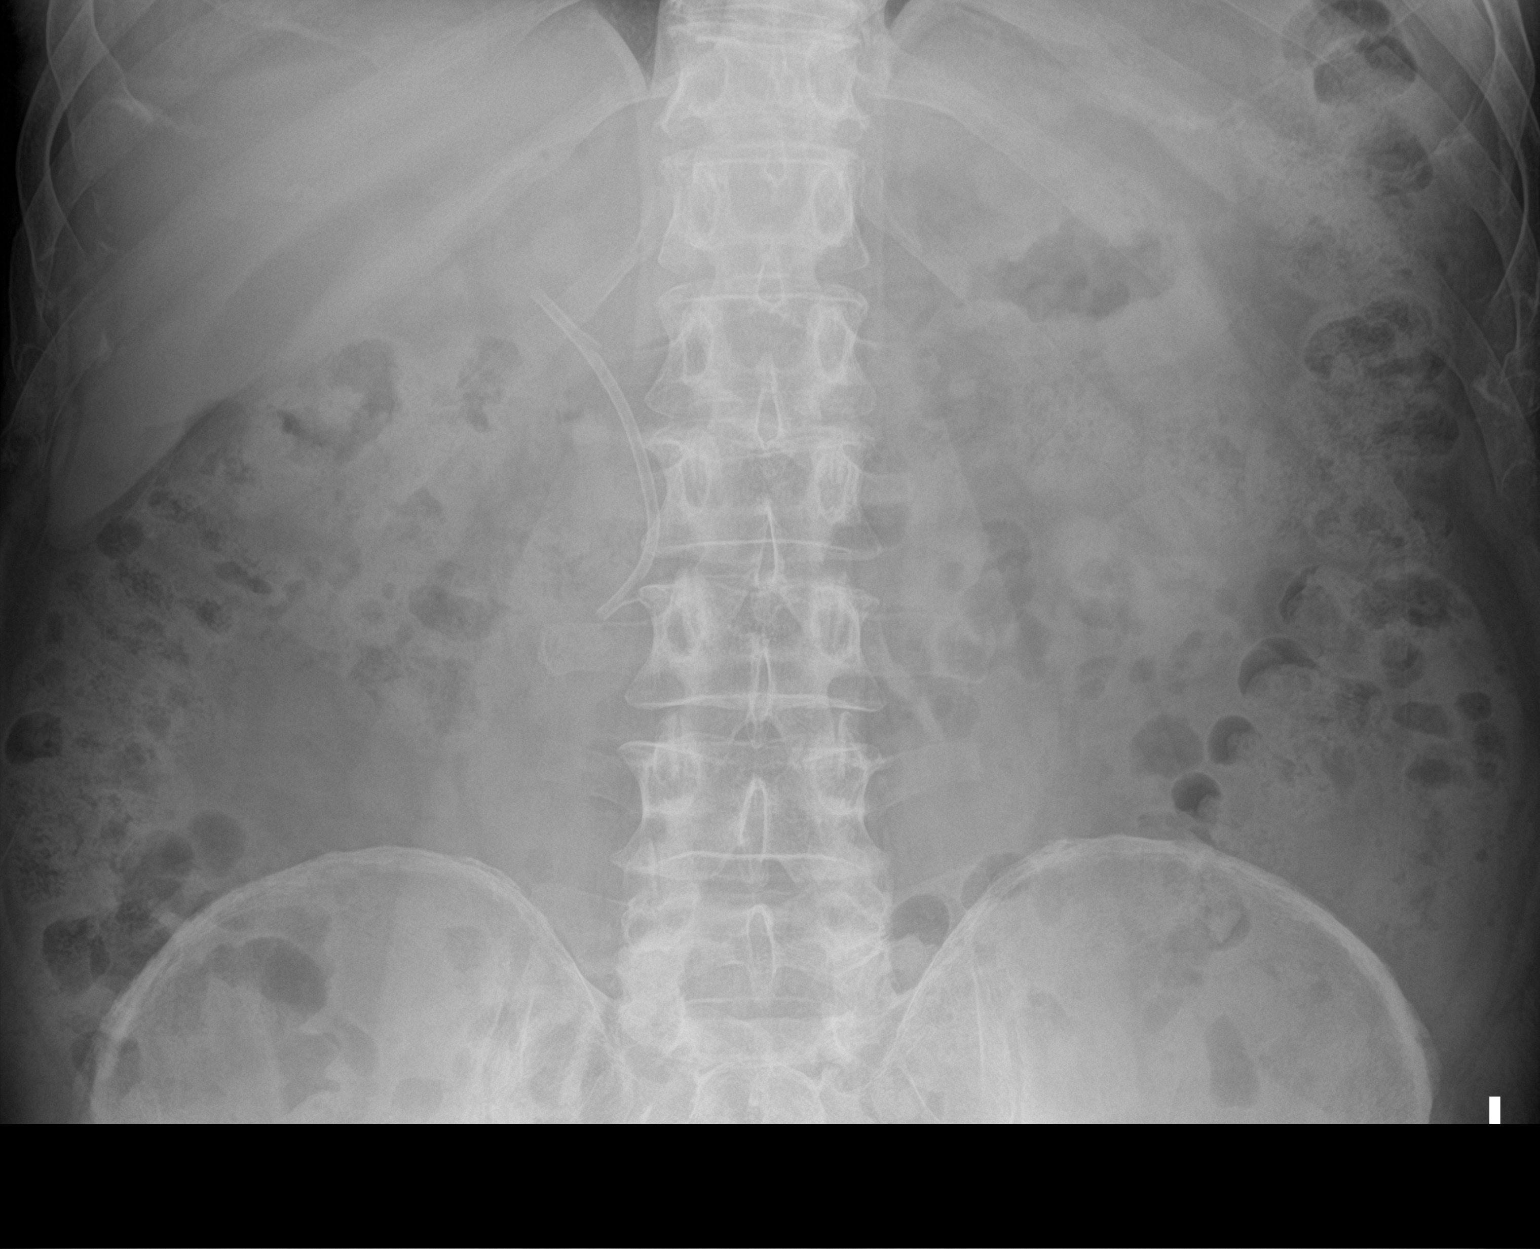

[abdomen supine (2 of 2)]
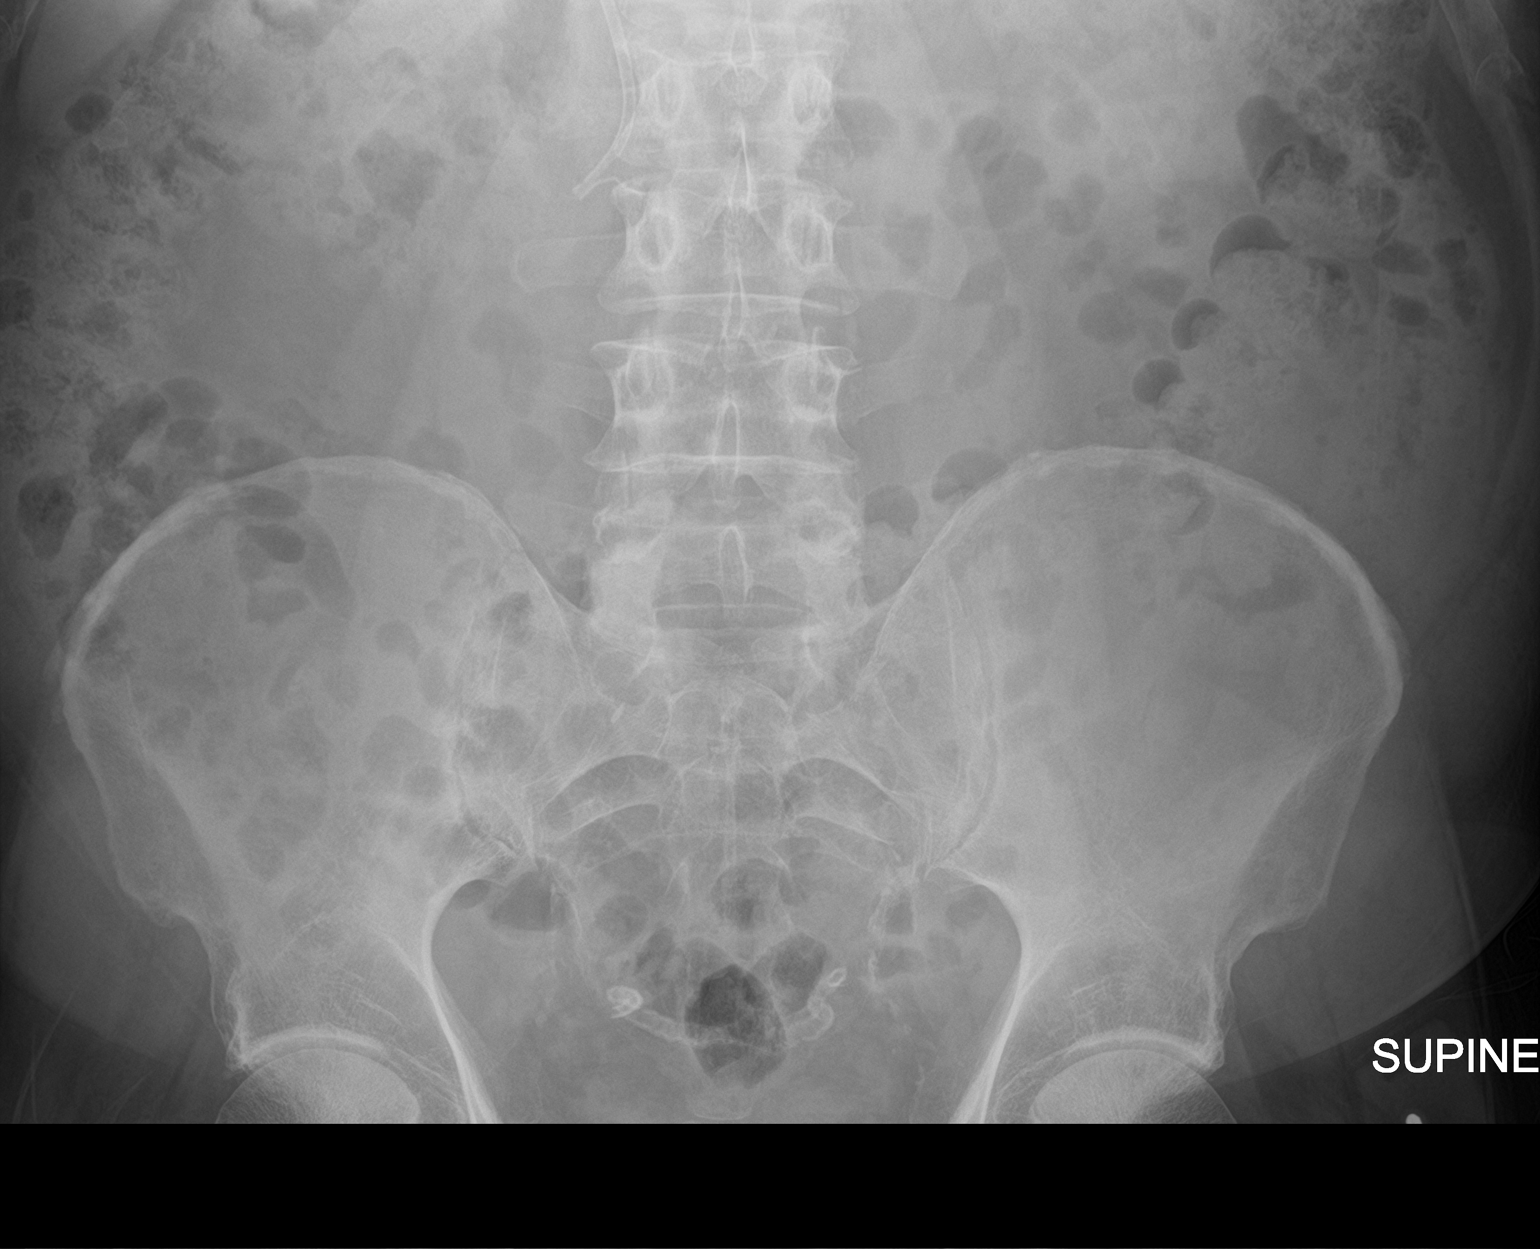

[3 of 3 positions shown; findings below may reference images not displayed]

FINDINGS: Biliary stent is again noted in the right abdomen, unchanged in
position. Bowel-gas pattern is nonobstructive. There is a large
amount of stool throughout the colon. There are prominent vascular
calcifications in the pelvis, unchanged. Lung bases are clear. No
acute fractures are seen.
IMPRESSION: 1. Nonobstructive bowel gas pattern.
2. Biliary stent is grossly unchanged in position.

## 2023-09-22 IMAGING — US US HEPATIC LIVER DOPPLER
1 series · 15 of 25 positions shown · non-contrast
Comparison: CT 12/14/2021 and previous

CLINICAL DATA: Elevated LFTs, biliary stent

EXAM:
DUPLEX ULTRASOUND OF LIVER
TECHNIQUE: Color and duplex Doppler ultrasound was performed to evaluate the
hepatic in-flow and out-flow vessels.

[Series 1: us art/ven flow abd pelv doppl mc & wl · 15 of 57 slices shown]
[im 1/57]
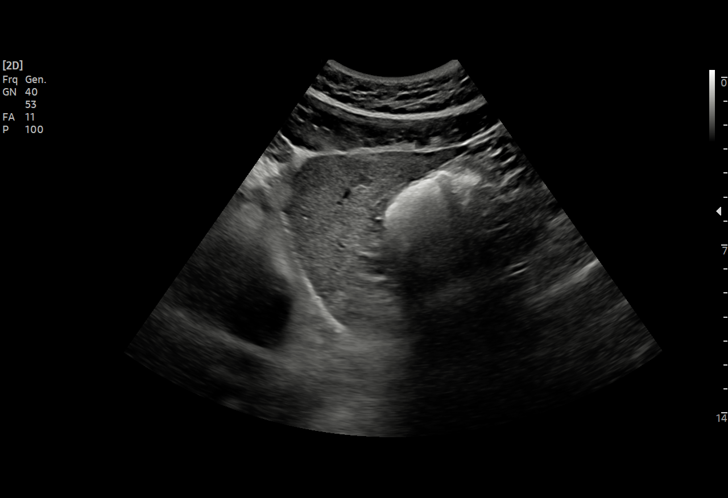
[im 5/57]
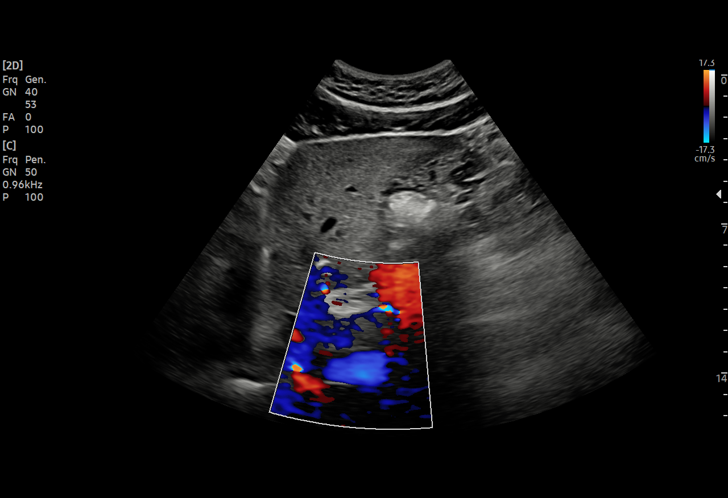
[im 10/57]
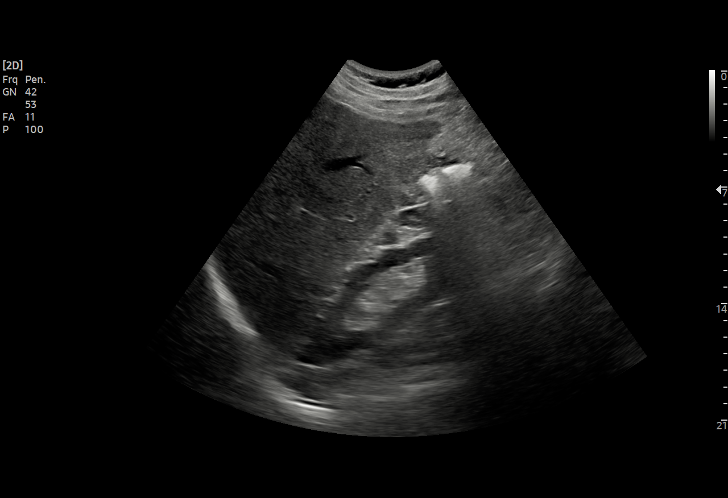
[im 12/57]
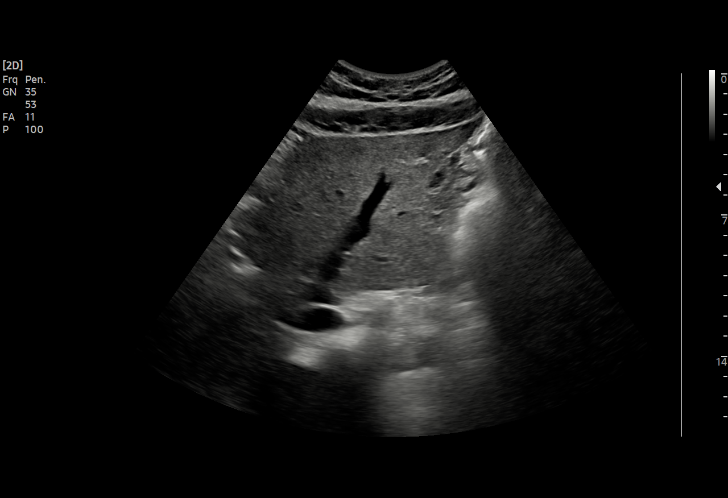
[im 17/57]
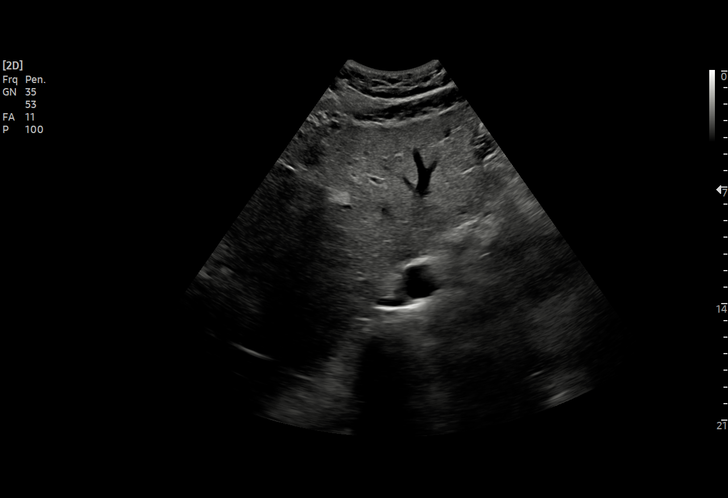
[im 22/57]
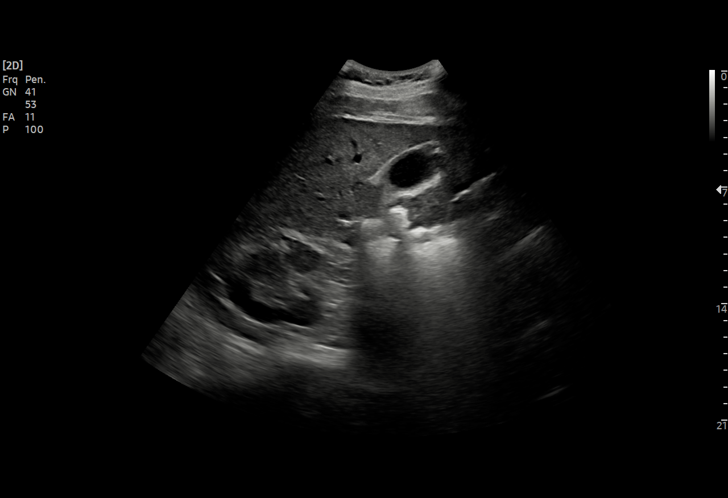
[im 24/57]
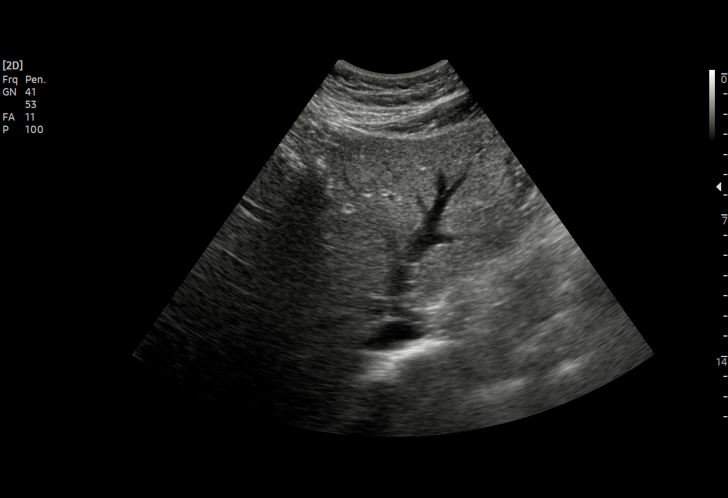
[im 29/57]
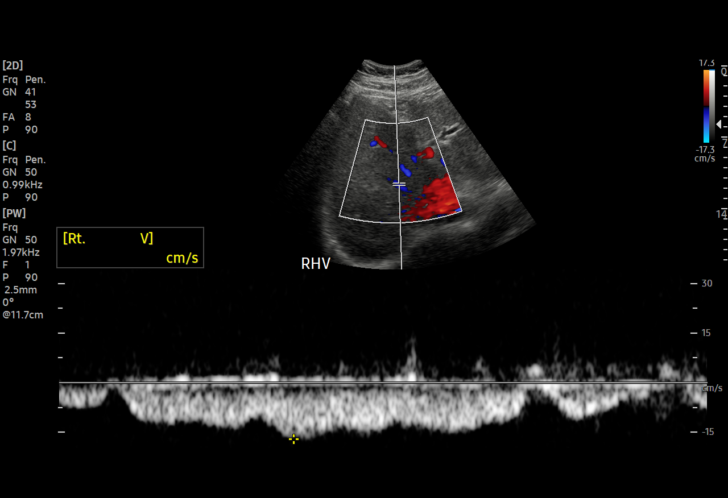
[im 33/57]
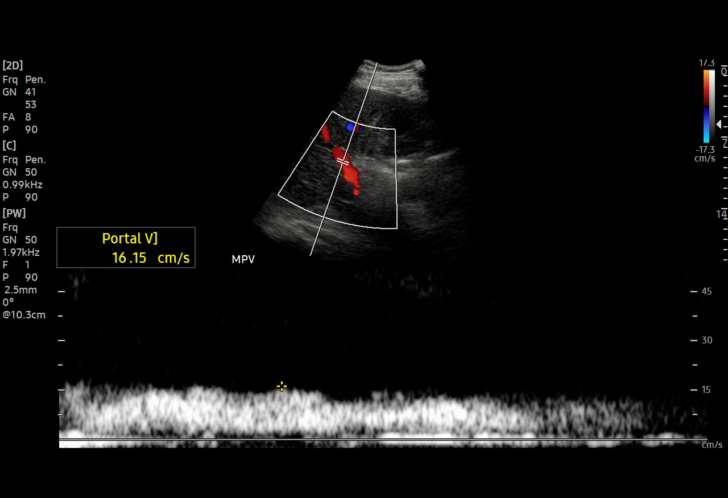
[im 36/57]
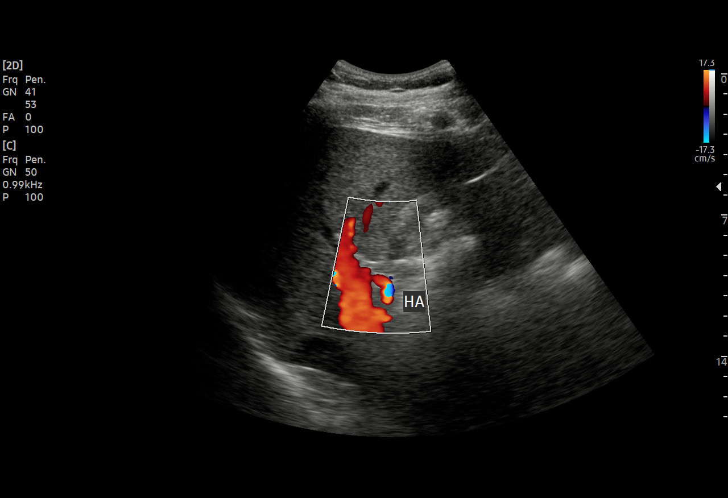
[im 40/57]
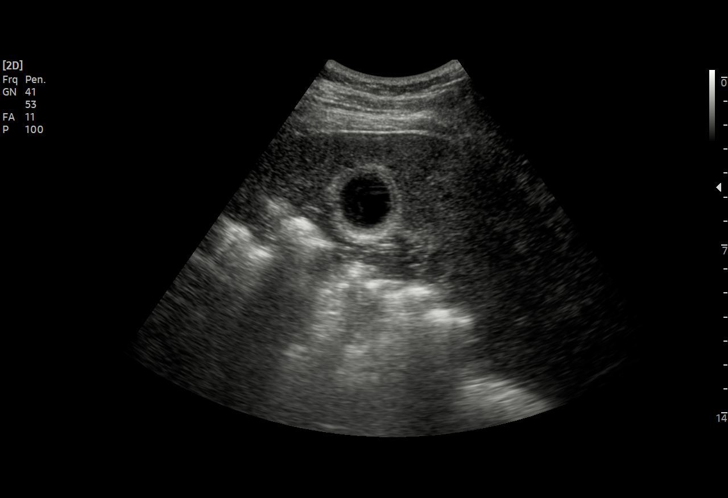
[im 45/57]
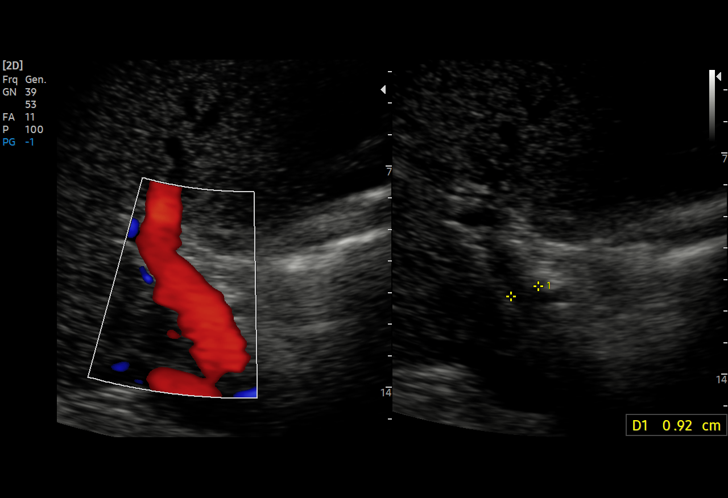
[im 47/57]
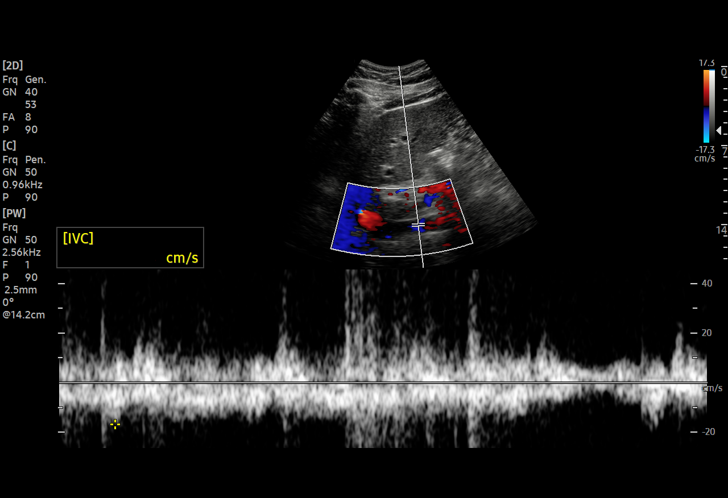
[im 52/57]
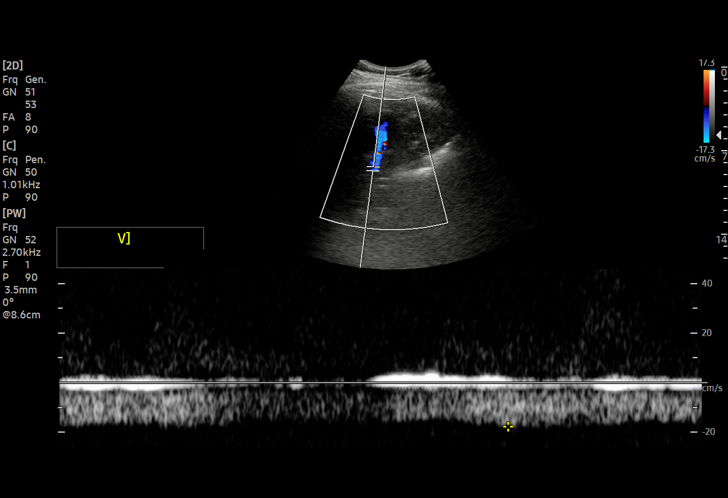
[im 57/57]
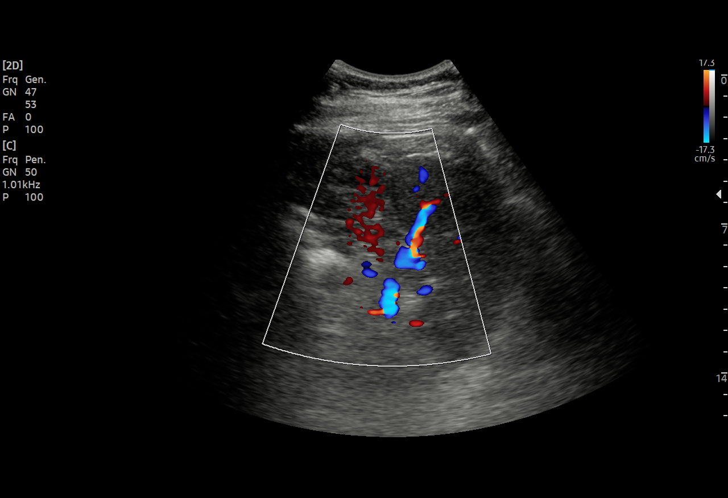

[15 of 25 positions shown; findings below may reference images not displayed]

FINDINGS: Liver: Normal parenchymal echogenicity. Normal hepatic contour
without nodularity.

No focal lesion, mass or intrahepatic biliary ductal dilatation.

Main Portal Vein size: 0.9 cm

Portal Vein Velocities (all hepatopetal):

Main Prox:  16 cm/sec

Main Mid: 17 cm/sec

Main Dist:  24 cm/sec
Right: 10 cm/sec
Left: 14 cm/sec

Hepatic Vein Velocities (all hepatofugal):

Right:  17 cm/sec

Middle:  14 cm/sec

Left:  8 cm/sec

IVC: Present and patent with normal respiratory phasicity. Velocity
17 cm/sec.

Hepatic Artery Velocity:  68 cm/sec

Splenic Vein Velocity:  18 cm/sec

Spleen: 4.2 cm x 9 cm x 4.3 cm with a total volume of 84 cm^3 (411
cm^3 is upper limit normal)

Portal Vein Occlusion/Thrombus: No

Splenic Vein Occlusion/Thrombus: No

Ascites: None

Varices: None

The gallbladder is incompletely distended, with wall thickening up
to 3.5 mm. Echogenic sludge in the lumen, no definite stones. No
pericholecystic fluid.
IMPRESSION: 1. Unremarkable hepatic vascular Doppler evaluation.
2. Sludge in the nondistended gallbladder.

## 2023-09-23 IMAGING — RF DG ERCP WO/W SPHINCTEROTOMY
1 series · 6 of 6 positions shown · non-contrast
Comparison: None.

CLINICAL DATA: ERCP

EXAM:
ERCP
TECHNIQUE: Multiple spot images obtained with the fluoroscopic device and
submitted for interpretation post-procedure.
FLUOROSCOPY TIME:  Refer to separate procedure report

[Series 1: run · 3 acquisitions, 6 frames shown]
[im 1/3]
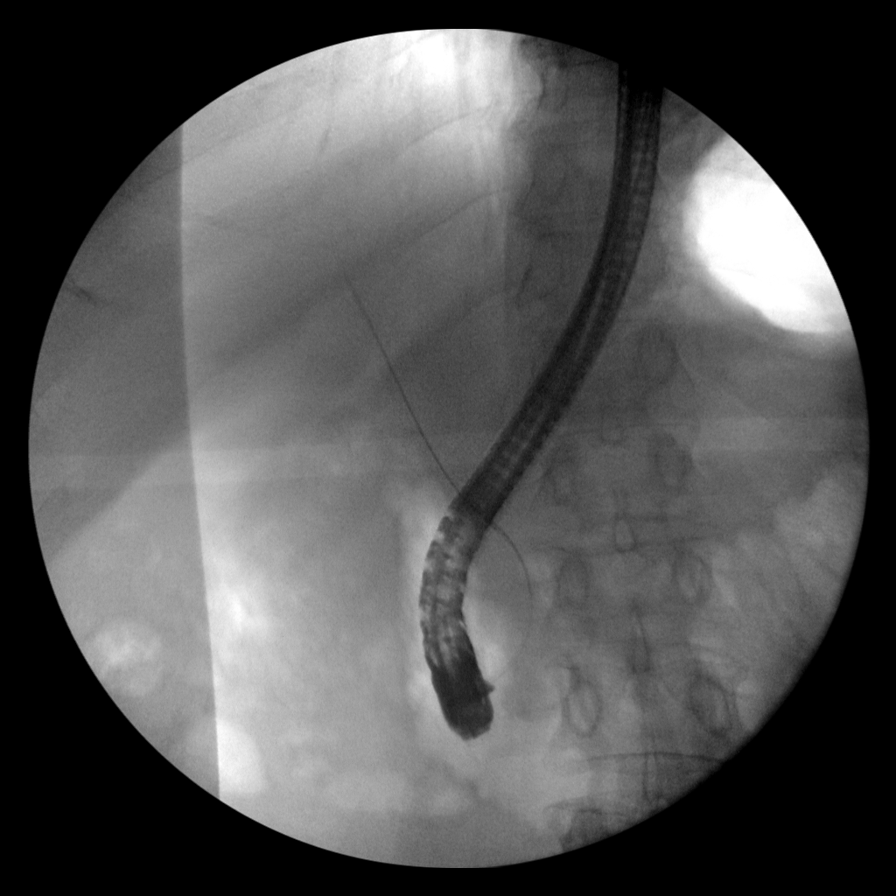
[im 1/3]
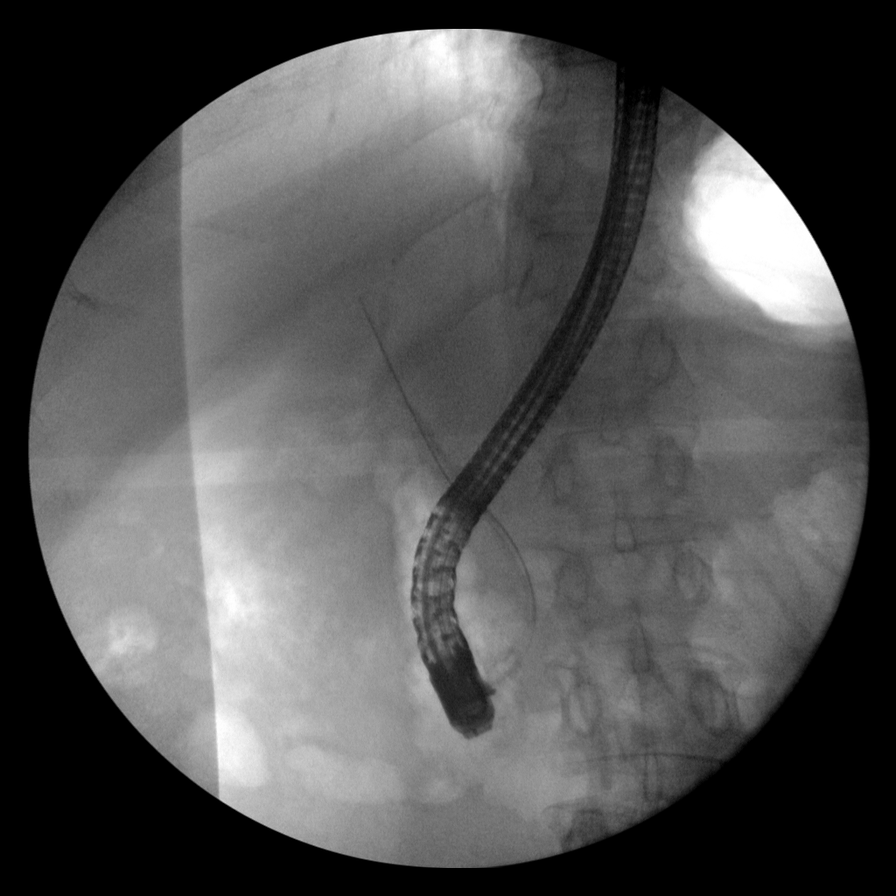
[im 1/3]
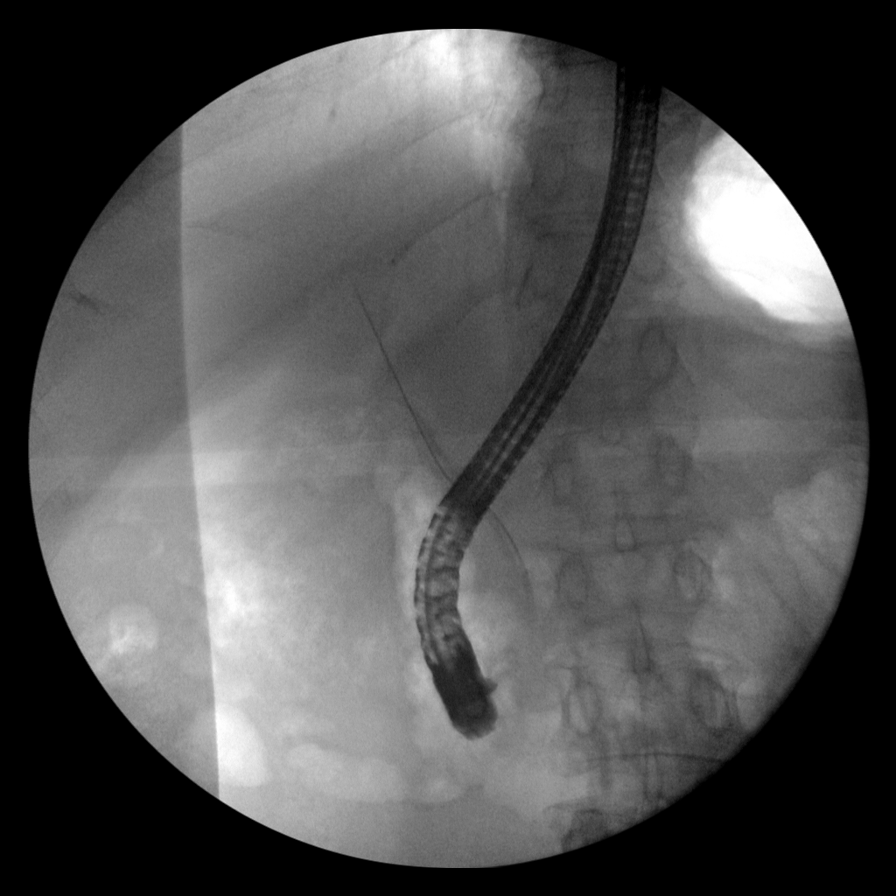
[im 1/3]
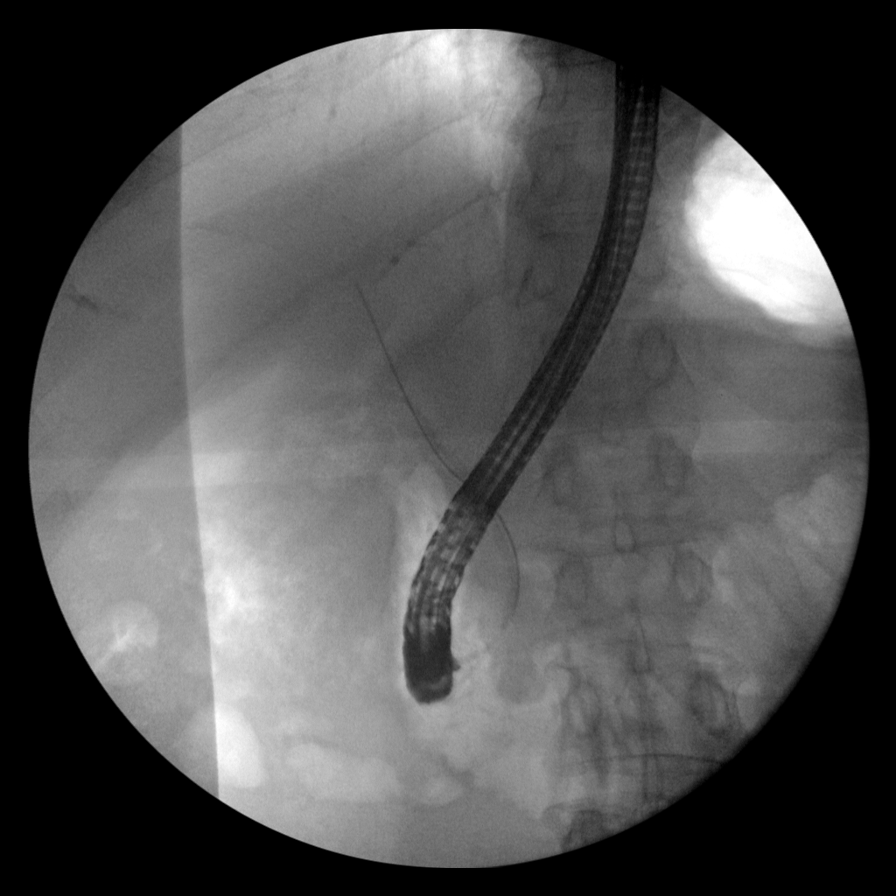
[im 2/3]
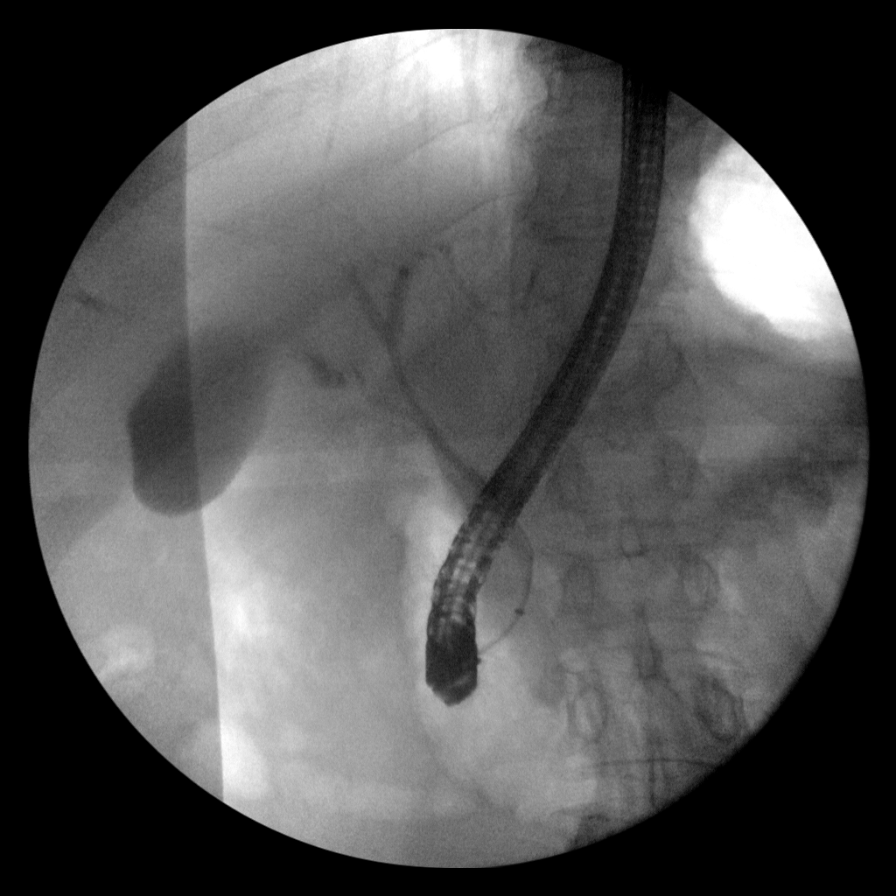
[im 3/3]
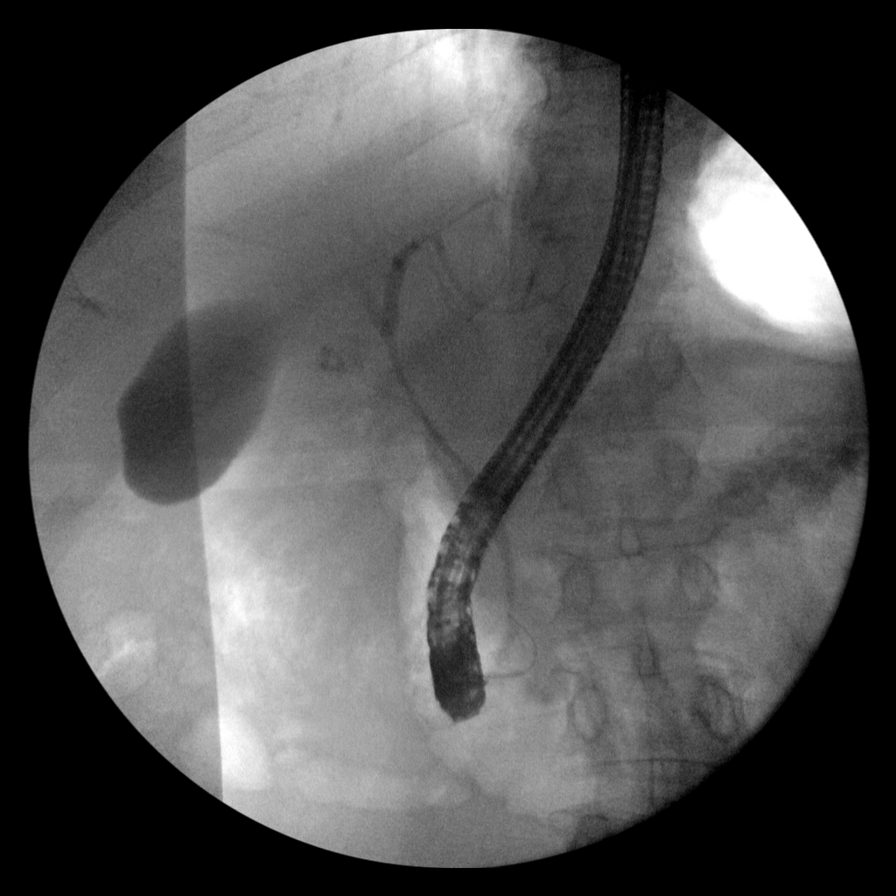

[6 of 6 positions shown; findings below may reference images not displayed]

FINDINGS: A single fluoroscopic cine loop and 2 fluoroscopic spot images are
submitted for review taken during ERCP. Fluoroscopic loop
demonstrates wire catheterization of the common bile duct. There is
faint contrast opacification of common bile duct after injection.
Subsequent images demonstrate opacification of the intrahepatic and
extrahepatic biliary tree. The intrahepatic bile ducts appear normal
in caliber. There is contrast through the cystic duct and in the
gallbladder. Balloon sweeps of the common bile duct appear to be
performed.
IMPRESSION: Fluoroscopic images obtained during ERCP as described. Refer to
separate procedure report for full details.

These images were submitted for radiologic interpretation only.
Please see the procedural report for the amount of contrast and the
fluoroscopy time utilized.

## 2023-10-14 DIAGNOSIS — E103553 Type 1 diabetes mellitus with stable proliferative diabetic retinopathy, bilateral: Secondary | ICD-10-CM | POA: Diagnosis not present

## 2023-10-14 DIAGNOSIS — Z23 Encounter for immunization: Secondary | ICD-10-CM | POA: Diagnosis not present

## 2023-10-24 NOTE — Progress Notes (Unsigned)
Cardiology Office Note    Date:  10/25/2023  ID:  DEMONT DOCHERTY, DOB 14-Jan-1961, MRN [id number] PCP:  Adrian Prince, MD  Cardiologist:  Kristeen Miss, MD  Electrophysiologist:  None   Chief Complaint: f/u CAD  History of Present Illness: .    Craig Hopkins is a 62 y.o. male with visit-pertinent history of CAD s/p DES to mLAD 2008, DM, HTN, prior tobacco abuse now vaping, alcohol abuse, ampullary adenoma, SP ERCP/EUS/endoscopic embolectomy 12/08/2021 complicated by pancreatitis with hepatitis, GERD, obesity, COPD, anxiety with panic attacks seen for routine follow-up. Last ischemic eval was by cath 2011 showing patent LAD stent, 20-30% mCx, othewise luminal irregularities. Last echo was in 2006 with normal EF and trivial valvular disease.  He is seen for routine follow-up overall doing well from cardiac standpoint. He reports chronic rare twinge of CP without recent angina. He has noticed a vague general sense of DOE that he attributes to not taking care of himself. He has not been eating right and has been gaining weight. He has continued to drink excess ETOH. He has not had any recent syncope or bleeding. Initial BP 140/88, recheck 162/90. He thinks it might have raised higher during conversation due to his anxiety. SBP at recent outside OV 132/90.  Labwork independently reviewed: 02/2022 LDL 68, trig 86, K 4.1, Cr 0.88, ALT wnl, Hgb 13.8, plt 459  ROS: .    Please see the history of present illness.  All other systems are reviewed and otherwise negative.  Studies Reviewed: Marland Kitchen    EKG:  EKG is ordered today, personally reviewed, demonstrating NSR 91bpm, one PVC, no acute STT changes  CV Studies: Cardiac studies reviewed are outlined and summarized above. Otherwise please see EMR for full report.   Current Reported Medications:.    Current Meds  Medication Sig   ALPRAZolam (XANAX) 0.25 MG tablet Take 0.25 mg by mouth at bedtime as needed for anxiety or sleep.     amLODipine-benazepril (LOTREL) 5-10 MG capsule Take 1 capsule by mouth daily.   aspirin EC 81 MG tablet Take 1 tablet (81 mg total) by mouth daily.   atorvastatin (LIPITOR) 40 MG tablet Take 40 mg by mouth daily.   Carboxymethylcellul-Glycerin (CLEAR EYES FOR DRY EYES) 1-0.25 % SOLN Place 1 drop into both eyes daily as needed (dry eyes).   clopidogrel (PLAVIX) 75 MG tablet Take 1 tablet (75 mg total) by mouth daily.   Continuous Blood Gluc Sensor (FREESTYLE LIBRE 2 SENSOR) MISC USE TO CHECK GLUCOSE DAILY - REPLACE SENSOR EVERY 14 DAYS.   Continuous Glucose Transmitter (DEXCOM G6 TRANSMITTER) MISC as directed.   hydrochlorothiazide (MICROZIDE) 12.5 MG capsule Take 12.5 mg by mouth daily.   HYDROcodone-acetaminophen (NORCO/VICODIN) 5-325 MG tablet as needed for severe pain (pain score 7-10).   insulin aspart (NOVOLOG) 100 UNIT/ML injection Inject into the skin See admin instructions. Via Insulin Pump   Insulin Human (INSULIN PUMP) SOLN Inject into the skin.   Melatonin 5 MG CHEW Chew 5 mg by mouth at bedtime as needed (sleep).   omeprazole (PRILOSEC) 20 MG capsule Take 1 capsule (20 mg total) by mouth daily.   tadalafil (CIALIS) 5 MG tablet Take 5 mg by mouth daily as needed for erectile dysfunction.    Physical Exam:    VS:  BP (!) 140/88   Pulse 91   Ht 5\' 7"  (1.702 m)   Wt 225 lb (102.1 kg)   SpO2 94%   BMI 35.24 kg/m  Wt Readings from Last 3 Encounters:  10/25/23 225 lb (102.1 kg)  10/29/22 214 lb 4.6 oz (97.2 kg)  03/09/22 208 lb (94.3 kg)    GEN: Well nourished, well developed in no acute distress NECK: No JVD; No carotid bruits CARDIAC: RRR, rare ectopy, no murmurs, rubs, gallops RESPIRATORY:  Clear to auscultation without rales, wheezing or rhonchi  ABDOMEN: Soft, non-tender, non-distended EXTREMITIES:  No edema; No acute deformity. Varicose veins noted  Asessement and Plan:.    1. CAD, DOE - no recurrent anginal symptoms. Has rare twinge of chest discomfort that is  longstanding and unchanged. He has noticed a vague sense of DOE which he attributes to being out of shape and gaining weight. Given his continued ETOH abuse and HTN I think it is prudent to update echocardiogram to ensure LVEF is stable. Will arrange. I discussed his DAPT plan with Dr. Elease Hashimoto prior to his visit today. Dr. Elease Hashimoto said patient is OK to discontinue ASA and continue clopidogrel monotherapy but the patient prefers to keep both on board for now. We discussed risk of bleeding with ETOH abuse and antiplatelet therapy, and also how to monitor for this. Will have him return for fasting labs in AM, had ham biscuit at lunch.  2. Essential HTN - suboptimal BP control noted today and at previous outside office in November. Anticipate need for improved control. Will have him return for fasting labs in AM to help guide next steps.   HYPERTENSION CONTROL Vitals:   10/25/23 1546 10/25/23 1643  BP: (!) 140/88 (!) 162/90    The patient's blood pressure is elevated above target today.  In order to address the patient's elevated BP: Labs and/or other diagnostics are currently pending prior to making blood pressure medication adjustments.     3. H/o tobacco use (now vaping), ongoing ETOH abuse -discussed that vaping has risks and should be utilized as a bridge off tobacco rather than long term use. My main concern is his continued ETOH abuse for which he is very forthcoming. I offered to refer him to social worker for resources for cessation and he politely declined. He reports he plans to f/u with his endocrinologist to discuss strategies for cutting down. We also briefly touched upon the book The Body Keeps The Score because there is also a significant history of anxiety which may make alcohol cessation challenging if not addressed across multidisciplinary strategy.  4. GERD - Prilosec can make Plavix less effective. Recommended changing to equivalent dose of Protonix 40mg  daily. Recommended he discuss  long term plan for PPI with his GI team.  5. PVC - one noted on EKG, rare ectopy on exam. Update labs as above to include lytes and TSH. He is generally asymptomatic with these but we will await updated echo as well to help guide any further surveillance. If EF normal, anticipate monitoring for any new symptoms.    Disposition: F/u with me in 4-6 weeks.  Signed, Laurann Montana, PA-C

## 2023-10-25 ENCOUNTER — Ambulatory Visit: Payer: BC Managed Care – PPO | Attending: Cardiology | Admitting: Physician Assistant

## 2023-10-25 ENCOUNTER — Other Ambulatory Visit: Payer: Self-pay

## 2023-10-25 ENCOUNTER — Encounter: Payer: Self-pay | Admitting: Physician Assistant

## 2023-10-25 VITALS — BP 162/90 | HR 91 | Ht 67.0 in | Wt 225.0 lb

## 2023-10-25 DIAGNOSIS — I2510 Atherosclerotic heart disease of native coronary artery without angina pectoris: Secondary | ICD-10-CM

## 2023-10-25 DIAGNOSIS — I10 Essential (primary) hypertension: Secondary | ICD-10-CM

## 2023-10-25 DIAGNOSIS — Z720 Tobacco use: Secondary | ICD-10-CM

## 2023-10-25 DIAGNOSIS — Z789 Other specified health status: Secondary | ICD-10-CM

## 2023-10-25 DIAGNOSIS — K219 Gastro-esophageal reflux disease without esophagitis: Secondary | ICD-10-CM

## 2023-10-25 DIAGNOSIS — R0609 Other forms of dyspnea: Secondary | ICD-10-CM

## 2023-10-25 MED ORDER — PANTOPRAZOLE SODIUM 40 MG PO TBEC: 40.0000 mg | DELAYED_RELEASE_TABLET | Freq: Every day | ORAL | 0 refills | Status: AC

## 2023-10-25 NOTE — Patient Instructions (Addendum)
Medication Instructions:  STOP Prilosec START Protonix 40mg  Take 1 tablet daily  *If you need a refill on your cardiac medications before your next appointment, please call your pharmacy*   Lab Work: TOMORROW OR SOME TIME THIS WEEK-FASTING CMET, CBC, MAG, LIPIDS, TSH If you have labs (blood work) drawn today and your tests are completely normal, you will receive your results only by: MyChart Message (if you have MyChart) OR A paper copy in the mail If you have any lab test that is abnormal or we need to change your treatment, we will call you to review the results.   Testing/Procedures: Your physician has requested that you have an echocardiogram. Echocardiography is a painless test that uses sound waves to create images of your heart. It provides your doctor with information about the size and shape of your heart and how well your heart's chambers and valves are working. This procedure takes approximately one hour. There are no restrictions for this procedure. Please do NOT wear cologne, perfume, aftershave, or lotions (deodorant is allowed). Please arrive 15 minutes prior to your appointment time.  Please note: We ask at that you not bring children with you during ultrasound (echo/ vascular) testing. Due to room size and safety concerns, children are not allowed in the ultrasound rooms during exams. Our front office staff cannot provide observation of children in our lobby area while testing is being conducted. An adult accompanying a patient to their appointment will only be allowed in the ultrasound room at the discretion of the ultrasound technician under special circumstances. We apologize for any inconvenience.   Follow-Up: At [facility], you and your health needs are our priority.  As part of our continuing mission to provide you with exceptional heart care, we have created designated Provider Care Teams.  These Care Teams include your primary Cardiologist (physician) and  Advanced Practice Providers (APPs -  Physician Assistants and Nurse Practitioners) who all work together to provide you with the care you need, when you need it.  We recommend signing up for the patient portal called "MyChart".  Sign up information is provided on this After Visit Summary.  MyChart is used to connect with patients for Virtual Visits (Telemedicine).  Patients are able to view lab/test results, encounter notes, upcoming appointments, etc.  Non-urgent messages can be sent to your provider as well.   To learn more about what you can do with MyChart, go to [email or web address].    Your next appointment:   4-6 week(s)  Provider:   Ronie Spies, PA-C       Other Instructions  Some studies suggest omeprazole/Prilosec (acid reflux medicine) interacts with clopidogrel/Plavix (blood thinner). We changed your omeprazole/Prilosec to the equivalent dose of pantoprazole/Protonix for less chance of interaction. Please discuss long term plan for this class of medicine with your GI team.

## 2023-10-26 DIAGNOSIS — I2510 Atherosclerotic heart disease of native coronary artery without angina pectoris: Secondary | ICD-10-CM | POA: Diagnosis not present

## 2023-10-26 DIAGNOSIS — I10 Essential (primary) hypertension: Secondary | ICD-10-CM | POA: Diagnosis not present

## 2023-10-26 DIAGNOSIS — Z720 Tobacco use: Secondary | ICD-10-CM | POA: Diagnosis not present

## 2023-10-26 DIAGNOSIS — K219 Gastro-esophageal reflux disease without esophagitis: Secondary | ICD-10-CM | POA: Diagnosis not present

## 2023-10-26 DIAGNOSIS — Z789 Other specified health status: Secondary | ICD-10-CM | POA: Diagnosis not present

## 2023-10-27 LAB — CBC
Hematocrit: 49.2 % (ref 37.5–51.0)
Hemoglobin: 16.9 g/dL (ref 13.0–17.7)
MCH: 32.3 pg (ref 26.6–33.0)
MCHC: 34.3 g/dL (ref 31.5–35.7)
MCV: 94 fL (ref 79–97)
Platelets: 354 10*3/uL (ref 150–450)
RBC: 5.23 x10E6/uL (ref 4.14–5.80)
RDW: 12.7 % (ref 11.6–15.4)
WBC: 7 10*3/uL (ref 3.4–10.8)

## 2023-10-27 LAB — COMPREHENSIVE METABOLIC PANEL
ALT: 92 [IU]/L — ABNORMAL HIGH (ref 0–44)
AST: 84 [IU]/L — ABNORMAL HIGH (ref 0–40)
Albumin: 4.4 g/dL (ref 3.8–4.9)
Alkaline Phosphatase: 74 [IU]/L (ref 44–121)
BUN/Creatinine Ratio: 19 (ref 10–24)
BUN: 18 mg/dL (ref 8–27)
Bilirubin Total: 1.5 mg/dL — ABNORMAL HIGH (ref 0.0–1.2)
CO2: 30 mmol/L — ABNORMAL HIGH (ref 20–29)
Calcium: 9.7 mg/dL (ref 8.6–10.2)
Chloride: 98 mmol/L (ref 96–106)
Creatinine, Ser: 0.97 mg/dL (ref 0.76–1.27)
Globulin, Total: 2.4 g/dL (ref 1.5–4.5)
Glucose: 182 mg/dL — ABNORMAL HIGH (ref 70–99)
Potassium: 4.6 mmol/L (ref 3.5–5.2)
Sodium: 141 mmol/L (ref 134–144)
Total Protein: 6.8 g/dL (ref 6.0–8.5)
eGFR: 89 mL/min/{1.73_m2} (ref >59)

## 2023-10-27 LAB — LIPID PANEL
Chol/HDL Ratio: 1.9 {ratio} (ref 0.0–5.0)
Cholesterol, Total: 195 mg/dL (ref 100–199)
HDL: 104 mg/dL (ref >39)
LDL Chol Calc (NIH): 80 mg/dL (ref 0–99)
Triglycerides: 61 mg/dL (ref 0–149)
VLDL Cholesterol Cal: 11 mg/dL (ref 5–40)

## 2023-10-27 LAB — TSH: TSH: 2.38 u[IU]/mL (ref 0.450–4.500)

## 2023-10-27 LAB — MAGNESIUM: Magnesium: 2 mg/dL (ref 1.6–2.3)

## 2023-10-28 ENCOUNTER — Ambulatory Visit: Payer: BC Managed Care – PPO | Admitting: Cardiology

## 2023-11-09 DIAGNOSIS — E10319 Type 1 diabetes mellitus with unspecified diabetic retinopathy without macular edema: Secondary | ICD-10-CM | POA: Diagnosis not present

## 2023-11-09 DIAGNOSIS — Z794 Long term (current) use of insulin: Secondary | ICD-10-CM | POA: Diagnosis not present

## 2023-11-09 DIAGNOSIS — E109 Type 1 diabetes mellitus without complications: Secondary | ICD-10-CM | POA: Diagnosis not present

## 2023-12-08 ENCOUNTER — Encounter (HOSPITAL_COMMUNITY): Payer: Self-pay | Admitting: Physician Assistant

## 2023-12-16 ENCOUNTER — Telehealth (HOSPITAL_COMMUNITY): Payer: Self-pay | Admitting: Physician Assistant

## 2023-12-16 NOTE — Telephone Encounter (Signed)
Just an FYI. We have made several attempts to contact this patient including sending a letter to schedule or reschedule their echocardiogram. We will be removing the patient from the echo WQ.   MAILED LETTER [facility]  12/08/23 [facility] to schedule @ 23;22/[facility]  12/02/23 [facility] to schedule @ 8:52/[facility]  pt will call in 2025 to schedule.dp      Thank you

## 2024-02-01 DIAGNOSIS — I10 Essential (primary) hypertension: Secondary | ICD-10-CM | POA: Diagnosis not present

## 2024-02-01 DIAGNOSIS — E10319 Type 1 diabetes mellitus with unspecified diabetic retinopathy without macular edema: Secondary | ICD-10-CM | POA: Diagnosis not present

## 2024-02-01 DIAGNOSIS — E103553 Type 1 diabetes mellitus with stable proliferative diabetic retinopathy, bilateral: Secondary | ICD-10-CM | POA: Diagnosis not present

## 2024-02-22 DIAGNOSIS — H2513 Age-related nuclear cataract, bilateral: Secondary | ICD-10-CM | POA: Diagnosis not present

## 2024-02-22 DIAGNOSIS — H43813 Vitreous degeneration, bilateral: Secondary | ICD-10-CM | POA: Diagnosis not present

## 2024-03-21 DIAGNOSIS — H43813 Vitreous degeneration, bilateral: Secondary | ICD-10-CM | POA: Diagnosis not present

## 2024-03-21 DIAGNOSIS — H33302 Unspecified retinal break, left eye: Secondary | ICD-10-CM | POA: Diagnosis not present

## 2024-03-21 DIAGNOSIS — H3561 Retinal hemorrhage, right eye: Secondary | ICD-10-CM | POA: Diagnosis not present

## 2024-03-21 DIAGNOSIS — H4311 Vitreous hemorrhage, right eye: Secondary | ICD-10-CM | POA: Diagnosis not present

## 2024-03-22 DIAGNOSIS — H33102 Unspecified retinoschisis, left eye: Secondary | ICD-10-CM | POA: Diagnosis not present

## 2024-03-22 DIAGNOSIS — H33322 Round hole, left eye: Secondary | ICD-10-CM | POA: Diagnosis not present

## 2024-03-22 DIAGNOSIS — H3561 Retinal hemorrhage, right eye: Secondary | ICD-10-CM | POA: Diagnosis not present

## 2024-03-22 DIAGNOSIS — H4311 Vitreous hemorrhage, right eye: Secondary | ICD-10-CM | POA: Diagnosis not present

## 2024-04-05 DIAGNOSIS — H33322 Round hole, left eye: Secondary | ICD-10-CM | POA: Diagnosis not present

## 2024-04-05 DIAGNOSIS — H33311 Horseshoe tear of retina without detachment, right eye: Secondary | ICD-10-CM | POA: Diagnosis not present

## 2024-04-05 DIAGNOSIS — H4311 Vitreous hemorrhage, right eye: Secondary | ICD-10-CM | POA: Diagnosis not present

## 2024-04-05 DIAGNOSIS — H3561 Retinal hemorrhage, right eye: Secondary | ICD-10-CM | POA: Diagnosis not present

## 2024-04-19 DIAGNOSIS — H33311 Horseshoe tear of retina without detachment, right eye: Secondary | ICD-10-CM | POA: Diagnosis not present

## 2024-05-03 DIAGNOSIS — H33322 Round hole, left eye: Secondary | ICD-10-CM | POA: Diagnosis not present

## 2024-06-09 DIAGNOSIS — Z0000 Encounter for general adult medical examination without abnormal findings: Secondary | ICD-10-CM | POA: Diagnosis not present

## 2024-06-09 DIAGNOSIS — E10319 Type 1 diabetes mellitus with unspecified diabetic retinopathy without macular edema: Secondary | ICD-10-CM | POA: Diagnosis not present

## 2024-06-09 DIAGNOSIS — E785 Hyperlipidemia, unspecified: Secondary | ICD-10-CM | POA: Diagnosis not present

## 2024-06-09 DIAGNOSIS — D126 Benign neoplasm of colon, unspecified: Secondary | ICD-10-CM | POA: Diagnosis not present

## 2024-06-09 DIAGNOSIS — Z125 Encounter for screening for malignant neoplasm of prostate: Secondary | ICD-10-CM | POA: Diagnosis not present

## 2024-06-09 DIAGNOSIS — I10 Essential (primary) hypertension: Secondary | ICD-10-CM | POA: Diagnosis not present

## 2024-08-08 DIAGNOSIS — E10319 Type 1 diabetes mellitus with unspecified diabetic retinopathy without macular edema: Secondary | ICD-10-CM | POA: Diagnosis not present

## 2024-08-08 DIAGNOSIS — Z794 Long term (current) use of insulin: Secondary | ICD-10-CM | POA: Diagnosis not present

## 2024-08-08 DIAGNOSIS — E109 Type 1 diabetes mellitus without complications: Secondary | ICD-10-CM | POA: Diagnosis not present

## 2024-10-16 DIAGNOSIS — L570 Actinic keratosis: Secondary | ICD-10-CM | POA: Diagnosis not present

## 2024-10-16 DIAGNOSIS — D225 Melanocytic nevi of trunk: Secondary | ICD-10-CM | POA: Diagnosis not present

## 2024-10-16 DIAGNOSIS — Z1283 Encounter for screening for malignant neoplasm of skin: Secondary | ICD-10-CM | POA: Diagnosis not present

## 2024-10-16 DIAGNOSIS — X32XXXA Exposure to sunlight, initial encounter: Secondary | ICD-10-CM | POA: Diagnosis not present

## 2024-10-16 DIAGNOSIS — L308 Other specified dermatitis: Secondary | ICD-10-CM | POA: Diagnosis not present

## 2024-10-17 DIAGNOSIS — E10319 Type 1 diabetes mellitus with unspecified diabetic retinopathy without macular edema: Secondary | ICD-10-CM | POA: Diagnosis not present

## 2024-10-17 DIAGNOSIS — E103553 Type 1 diabetes mellitus with stable proliferative diabetic retinopathy, bilateral: Secondary | ICD-10-CM | POA: Diagnosis not present

## 2024-10-17 DIAGNOSIS — Z23 Encounter for immunization: Secondary | ICD-10-CM | POA: Diagnosis not present

## 2024-10-17 DIAGNOSIS — I10 Essential (primary) hypertension: Secondary | ICD-10-CM | POA: Diagnosis not present

## 2024-10-21 ENCOUNTER — Encounter: Payer: Self-pay | Admitting: Internal Medicine

## 2024-10-24 DIAGNOSIS — E109 Type 1 diabetes mellitus without complications: Secondary | ICD-10-CM | POA: Diagnosis not present

## 2024-10-24 DIAGNOSIS — H524 Presbyopia: Secondary | ICD-10-CM | POA: Diagnosis not present

## 2024-10-24 DIAGNOSIS — H43813 Vitreous degeneration, bilateral: Secondary | ICD-10-CM | POA: Diagnosis not present

## 2024-10-24 DIAGNOSIS — H2513 Age-related nuclear cataract, bilateral: Secondary | ICD-10-CM | POA: Diagnosis not present

## 2024-10-24 DIAGNOSIS — H5203 Hypermetropia, bilateral: Secondary | ICD-10-CM | POA: Diagnosis not present

## 2024-10-24 DIAGNOSIS — H40013 Open angle with borderline findings, low risk, bilateral: Secondary | ICD-10-CM | POA: Diagnosis not present

## 2024-10-30 ENCOUNTER — Telehealth: Payer: Self-pay

## 2024-10-30 NOTE — Telephone Encounter (Addendum)
 Attempted to reach patient concerning EGD recall; unable to speak with patient;  left message and number to the office for patient to call back and schedule appts;

## 2024-11-15 NOTE — Telephone Encounter (Signed)
 Attempted to reach patient concerning EGD recall; unable to speak with patient;  left message and number to the office for patient to call back and schedule appts;
